# Patient Record
Sex: Female | Born: 1990 | Race: Black or African American | Hispanic: No | Marital: Married | Smoking: Never smoker
Health system: Southern US, Community
[De-identification: ages and names within clinical notes are randomized; demographics above are authoritative.]

## PROBLEM LIST (undated history)

## (undated) ENCOUNTER — Inpatient Hospital Stay (HOSPITAL_COMMUNITY): Payer: Self-pay

## (undated) DIAGNOSIS — IMO0002 Reserved for concepts with insufficient information to code with codable children: Secondary | ICD-10-CM

## (undated) DIAGNOSIS — B9689 Other specified bacterial agents as the cause of diseases classified elsewhere: Secondary | ICD-10-CM

## (undated) DIAGNOSIS — M329 Systemic lupus erythematosus, unspecified: Secondary | ICD-10-CM

## (undated) DIAGNOSIS — B373 Candidiasis of vulva and vagina: Secondary | ICD-10-CM

## (undated) DIAGNOSIS — N760 Acute vaginitis: Secondary | ICD-10-CM

## (undated) HISTORY — PX: CHOLECYSTECTOMY: SHX55

---

## 2015-12-09 ENCOUNTER — Emergency Department (HOSPITAL_COMMUNITY)
Admission: EM | Admit: 2015-12-09 | Discharge: 2015-12-09 | Disposition: A | Discharge status: Home / Self Care | Payer: Medicaid Other | Attending: Emergency Medicine | Admitting: Emergency Medicine

## 2015-12-09 ENCOUNTER — Encounter (HOSPITAL_COMMUNITY): Payer: Self-pay | Admitting: Emergency Medicine

## 2015-12-09 DIAGNOSIS — N760 Acute vaginitis: Secondary | ICD-10-CM | POA: Diagnosis not present

## 2015-12-09 DIAGNOSIS — Z79891 Long term (current) use of opiate analgesic: Secondary | ICD-10-CM | POA: Insufficient documentation

## 2015-12-09 DIAGNOSIS — N898 Other specified noninflammatory disorders of vagina: Secondary | ICD-10-CM | POA: Diagnosis present

## 2015-12-09 DIAGNOSIS — Z79899 Other long term (current) drug therapy: Secondary | ICD-10-CM | POA: Insufficient documentation

## 2015-12-09 DIAGNOSIS — B9689 Other specified bacterial agents as the cause of diseases classified elsewhere: Secondary | ICD-10-CM

## 2015-12-09 HISTORY — DX: Systemic lupus erythematosus, unspecified: M32.9

## 2015-12-09 HISTORY — DX: Reserved for concepts with insufficient information to code with codable children: IMO0002

## 2015-12-09 LAB — URINALYSIS, ROUTINE W REFLEX MICROSCOPIC
BILIRUBIN URINE: NEGATIVE
Glucose, UA: NEGATIVE mg/dL
KETONES UR: 40 mg/dL — AB
LEUKOCYTES UA: NEGATIVE
NITRITE: NEGATIVE
Specific Gravity, Urine: 1.04 — ABNORMAL HIGH (ref 1.005–1.030)
pH: 6 (ref 5.0–8.0)

## 2015-12-09 LAB — WET PREP, GENITAL
Sperm: NONE SEEN
Trich, Wet Prep: NONE SEEN
Yeast Wet Prep HPF POC: NONE SEEN

## 2015-12-09 LAB — URINE MICROSCOPIC-ADD ON

## 2015-12-09 LAB — PREGNANCY, URINE: Preg Test, Ur: NEGATIVE

## 2015-12-09 MED ORDER — AZITHROMYCIN 250 MG PO TABS
1000.0000 mg | ORAL_TABLET | Freq: Once | ORAL | Status: AC
  Administered 2015-12-09: 1000 mg via ORAL
  Filled 2015-12-09: qty 4

## 2015-12-09 MED ORDER — LIDOCAINE HCL 1 % IJ SOLN
INTRAMUSCULAR | Status: AC
  Administered 2015-12-09: 20 mL
  Filled 2015-12-09: qty 20

## 2015-12-09 MED ORDER — CEFTRIAXONE SODIUM 250 MG IJ SOLR
250.0000 mg | Freq: Once | INTRAMUSCULAR | Status: AC
  Administered 2015-12-09: 250 mg via INTRAMUSCULAR
  Filled 2015-12-09: qty 250

## 2015-12-09 MED ORDER — METRONIDAZOLE 500 MG PO TABS: 500.0000 mg | ORAL_TABLET | Freq: Two times a day (BID) | ORAL | Status: DC

## 2015-12-09 NOTE — Discharge Instructions (Signed)
Bacterial Vaginosis Bacterial vaginosis is an infection of the vagina. It happens when too many germs (bacteria) grow in the vagina. Having this infection puts you at risk for getting other infections from sex. Treating this infection can help lower your risk for other infections, such as:   Chlamydia.  Gonorrhea.  HIV.  Herpes. HOME CARE  Take your medicine as told by your doctor.  Finish your medicine even if you start to feel better.  Tell your sex partner that you have an infection. They should see their doctor for treatment.  During treatment:  Avoid sex or use condoms correctly.  Do not douche.  Do not drink alcohol unless your doctor tells you it is ok.  Do not breastfeed unless your doctor tells you it is ok. GET HELP IF:  You are not getting better after 3 days of treatment.  You have more grey fluid (discharge) coming from your vagina than before.  You have more pain than before.  You have a fever. MAKE SURE YOU:   Understand these instructions.  Will watch your condition.  Will get help right away if you are not doing well or get worse.   This information is not intended to replace advice given to you by your health care provider. Make sure you discuss any questions you have with your health care provider.   Take antibiotics as prescribed. You still have gonorrhea, chlamydia, HIV and syphilis results that are pending. These results will come back in the next 2-3 days. Please contact your rheumatologist to update them that you'll be taking antibiotics. Follow up with women's clinic for reevaluation or as needed. Return to the ED if you experience severe worsening of her symptoms, abdominal pain, fevers, pain with intercourse, burning with urination.

## 2015-12-09 NOTE — ED Notes (Signed)
Pt states that she is having malodorous vaginal discharge that started today.  Denies dysuria.  Denies abd pain.

## 2015-12-09 NOTE — ED Provider Notes (Signed)
CSN: [id number]     Arrival date & time 12/09/15  1454 History   First MD Initiated Contact with Patient 12/09/15 1656     Chief Complaint  Patient presents with  . Vaginal Discharge     (Consider location/radiation/quality/duration/timing/severity/associated sxs/prior Treatment) HPI   Kristina Vincent is a 25 year old female with a past medical history of lupus who presents to the ED today complaining of white vaginal discharge. Patient states her symptoms began today. She is associated external vaginal irritation but no itching. Her last measure. Was one month ago. She sexually active with one female partner and does not have any concern for STDs. Patient states that she feels that she may have a yeast infection. She denies any recent antibiotic use. Patient does take birth control. She denies any abdominal pain, fevers, chills, dyspareunia, dysuria or hematuria.  Past Medical History  Diagnosis Date  . Lupus (HCC)    History reviewed. No pertinent past surgical history. History reviewed. No pertinent family history. Social History  Substance Use Topics  . Smoking status: Never Smoker   . Smokeless tobacco: None  . Alcohol Use: No   OB History    No data available     Review of Systems  All other systems reviewed and are negative.     Allergies  Review of patient's allergies indicates no known allergies.  Home Medications   Prior to Admission medications   Medication Sig Start Date End Date Taking? Authorizing Provider  acetaminophen (TYLENOL) 500 MG tablet Take 500-1,000 mg by mouth every 6 (six) hours as needed for moderate pain or headache.   Yes Historical Provider, MD  calcium carbonate (OS-CAL - DOSED IN MG OF ELEMENTAL CALCIUM) 1250 (500 Ca) MG tablet Take 1 tablet by mouth daily with breakfast.   Yes Historical Provider, MD  diphenhydramine-acetaminophen (TYLENOL PM) 25-500 MG TABS tablet Take 1-2 tablets by mouth at bedtime as needed (pain).   Yes Historical  Provider, MD  lisinopril (PRINIVIL,ZESTRIL) 5 MG tablet Take 5 mg by mouth daily. 10/27/15  Yes Historical Provider, MD  MONONESSA 0.25-35 MG-MCG tablet Take 1 tablet by mouth daily. 09/30/15  Yes Historical Provider, MD  mycophenolate (CELLCEPT) 500 MG tablet Take 1,000 mg by mouth daily. 10/27/15  Yes Historical Provider, MD  omeprazole (PRILOSEC) 20 MG capsule Take 20 mg by mouth daily. 10/27/15  Yes Historical Provider, MD  predniSONE (DELTASONE) 20 MG tablet Take 20 mg by mouth daily with breakfast. 10/27/15  Yes Historical Provider, MD   BP 123/79 mmHg  Pulse 83  Temp(Src) 98.6 F (37 C) (Oral)  Resp 20  SpO2 100%  LMP 11/19/2015 (Approximate) Physical Exam  Constitutional: She is oriented to person, place, and time. She appears well-developed and well-nourished. No distress.  HENT:  Head: Normocephalic and atraumatic.  Eyes: Conjunctivae are normal. Right eye exhibits no discharge. Left eye exhibits no discharge. No scleral icterus.  Cardiovascular: Normal rate.   Pulmonary/Chest: Effort normal.  Abdominal: Soft. Bowel sounds are normal. She exhibits no distension. There is no tenderness. There is no rebound and no guarding.  Genitourinary:  No CMT. No adnexal tenderness. White vaginal discharge in vaginal vault. No external genital lesions.  Neurological: She is alert and oriented to person, place, and time. Coordination normal.  Skin: Skin is warm and dry. No rash noted. She is not diaphoretic. No erythema. No pallor.  Psychiatric: She has a normal mood and affect. Her behavior is normal.  Nursing note and vitals reviewed.   ED Course  Procedures (including critical care time) Labs Review Labs Reviewed  URINALYSIS, ROUTINE W REFLEX MICROSCOPIC (NOT AT [facility]) - Abnormal; Notable for the following:    APPearance CLOUDY (*)    Specific Gravity, Urine 1.040 (*)    Hgb urine dipstick MODERATE (*)    Ketones, ur 40 (*)    Protein, ur >300 (*)    All other components within normal  limits  URINE MICROSCOPIC-ADD ON - Abnormal; Notable for the following:    Squamous Epithelial / LPF 0-5 (*)    Bacteria, UA MANY (*)    All other components within normal limits  URINE CULTURE  WET PREP, GENITAL  PREGNANCY, URINE  RPR  HIV ANTIBODY (ROUTINE TESTING)  GC/CHLAMYDIA PROBE AMP ([place]) NOT AT [facility]    Imaging Review No results found. I have personally reviewed and evaluated these images and lab results as part of my medical decision-making.   EKG Interpretation None      MDM   Final diagnoses:  Vaginal discharge  Bacterial vaginosis    Patient to be discharged with instructions to follow up with OBGYN. Discussed importance of using protection when sexually active. Pt understands that they have GC/Chlamydia cultures pending and that they will need to inform all sexual partners if results return positive. Pt has been treated prophylacticly with azithromycin and rocephin due to pts history, pelvic exam, and wet prep with increased WBCs. Pt not concerning for PID because hemodynamically stable and no cervical motion tenderness on pelvic exam. Pt has also been treated with flagyl for Bacterial Vaginosis. I spoke with pharmacy as pt is on mycophenylate for lupus, pharmacist states that this should not cause a drug reaction with these antibiotics. Pt has been advised to not drink alcohol while on this medication. Pt will notify her rheumatologist that she is taking antibiotics.  Return precautions outlined in patient discharge instructions.       Lester Kinsman [place], PA-C 12/09/15 2101   Lorre Nick, MD 12/13/15 [phone]

## 2015-12-09 NOTE — Progress Notes (Signed)
Patient listed as having Medicaid insurance without a pcp.  [facility] on patient's Medicaid card [facility].  EDCM spoke to patient at bedside.  Patient reports her pcp is now 3 hours away as she has just moved here.  EDCM informed patient that [place].  [facility] with [facility] [place] providers who [place].  EDCM also provided patient with address and [place].  Patient thankful for resources.  No further EDCm needs at this time.

## 2015-12-10 LAB — HIV ANTIBODY (ROUTINE TESTING W REFLEX): HIV Screen 4th Generation wRfx: NONREACTIVE

## 2015-12-10 LAB — GC/CHLAMYDIA PROBE AMP (~~LOC~~) NOT AT ARMC
Chlamydia: POSITIVE — AB
Neisseria Gonorrhea: NEGATIVE

## 2015-12-10 LAB — RPR, QUANT+TP ABS (REFLEX)
Rapid Plasma Reagin, Quant: 1:1 {titer} — ABNORMAL HIGH
T Pallidum Abs: NEGATIVE

## 2015-12-10 LAB — SYPHILIS: RPR W/REFLEX TO RPR TITER AND TREPONEMAL ANTIBODIES, TRADITIONAL SCREENING AND DIAGNOSIS ALGORITHM: RPR Ser Ql: REACTIVE — AB

## 2015-12-11 ENCOUNTER — Telehealth (HOSPITAL_BASED_OUTPATIENT_CLINIC_OR_DEPARTMENT_OTHER): Payer: Self-pay

## 2015-12-11 LAB — URINE CULTURE

## 2015-12-13 ENCOUNTER — Telehealth (HOSPITAL_BASED_OUTPATIENT_CLINIC_OR_DEPARTMENT_OTHER): Payer: Self-pay | Admitting: Emergency Medicine

## 2015-12-14 ENCOUNTER — Encounter (HOSPITAL_COMMUNITY): Payer: Self-pay

## 2015-12-14 ENCOUNTER — Emergency Department (HOSPITAL_COMMUNITY)
Admission: EM | Admit: 2015-12-14 | Discharge: 2015-12-14 | Disposition: A | Discharge status: Home / Self Care | Payer: Medicaid Other | Attending: Emergency Medicine | Admitting: Emergency Medicine

## 2015-12-14 DIAGNOSIS — R21 Rash and other nonspecific skin eruption: Secondary | ICD-10-CM

## 2015-12-14 MED ORDER — CLOTRIMAZOLE 1 % EX CREA: TOPICAL_CREAM | CUTANEOUS | 0 refills | Status: DC

## 2015-12-14 NOTE — ED Notes (Signed)
Patient retrieved from waiting and escorted to room. Ambulatory from waiting to B19 with minimal difficulty. Patient given gown and asked to change.

## 2015-12-14 NOTE — ED Triage Notes (Signed)
Pt reports, weakenss and tired from lupus and feels like she is going to pass out and cant sleep and also reports rash between legs with irritaiton, pt sts recently treated for STD>

## 2015-12-14 NOTE — ED Notes (Signed)
Pt states he understands instructions. Home stable with steady gait. 

## 2015-12-14 NOTE — ED Provider Notes (Signed)
[facility]-EMERGENCY DEPT Provider Note   CSN: [id number] Arrival date & time: 12/14/15  4098  First Provider Contact:  First MD Initiated Contact with Patient 12/14/15 0700        History   Chief Complaint Chief Complaint  Patient presents with  . Rash  . Lupus    HPI Kristina Vincent is a 25 y.o. female.  HPI Patient presents to the emergency department with complaints of rash between her thighs.  She was recently diagnosed with a STD and started on Flagyl.  She reports ongoing vaginal discharge which is thin and watery.  She reports she's having to change her underwear several times a day.  She also reports history Lupus and feels like she might be having a lupus flare she has generalized fatigue and weakness and some lightheadedness.  She denies nausea and vomiting.    Past Medical History:  Diagnosis Date  . Lupus (HCC)     There are no active problems to display for this patient.   History reviewed. No pertinent surgical history.  OB History    No data available       Home Medications    Prior to Admission medications   Medication Sig Start Date End Date Taking? Authorizing Provider  metroNIDAZOLE (FLAGYL) 500 MG tablet Take 1 tablet (500 mg total) by mouth 2 (two) times daily. 12/09/15  Yes Samantha Tripp [place], PA-C  acetaminophen (TYLENOL) 500 MG tablet Take 500-1,000 mg by mouth every 6 (six) hours as needed for moderate pain or headache.    Historical Provider, MD  calcium carbonate (OS-CAL - DOSED IN MG OF ELEMENTAL CALCIUM) 1250 (500 Ca) MG tablet Take 1 tablet by mouth daily with breakfast.    Historical Provider, MD  clotrimazole (LOTRIMIN) 1 % cream Apply to affected area 2 times daily 12/14/15   Azalia Bilis, MD  diphenhydramine-acetaminophen (TYLENOL PM) 25-500 MG TABS tablet Take 1-2 tablets by mouth at bedtime as needed (pain).    Historical Provider, MD  lisinopril (PRINIVIL,ZESTRIL) 5 MG tablet Take 5 mg by mouth daily. 10/27/15   Historical Provider, MD    MONONESSA 0.25-35 MG-MCG tablet Take 1 tablet by mouth daily. 09/30/15   Historical Provider, MD  mycophenolate (CELLCEPT) 500 MG tablet Take 1,000 mg by mouth daily. 10/27/15   Historical Provider, MD  omeprazole (PRILOSEC) 20 MG capsule Take 20 mg by mouth daily. 10/27/15   Historical Provider, MD  predniSONE (DELTASONE) 20 MG tablet Take 20 mg by mouth daily with breakfast. 10/27/15   Historical Provider, MD    Family History History reviewed. No pertinent family history.  Social History Social History  Substance Use Topics  . Smoking status: Never Smoker  . Smokeless tobacco: Not on file  . Alcohol use No     Allergies   Review of patient's allergies indicates no known allergies.   Review of Systems Review of Systems  All other systems reviewed and are negative.    Physical Exam Updated Vital Signs BP 128/89 (BP Location: Left Arm)   Pulse 88   Temp 98.1 F (36.7 C) (Oral)   Resp 20   Ht  (1.6 m)   Wt 235 lb 12.8 oz (107 kg)   LMP 11/19/2015 (Approximate)   SpO2 100%   BMI 41.77 kg/m   Physical Exam  Constitutional: She is oriented to person, place, and time. She appears well-developed and well-nourished.  HENT:  Head: Normocephalic.  Eyes: EOM are [id number]: Normal range of motion.  Pulmonary/Chest:  Effort normal.  Abdominal: She exhibits no distension.  Genitourinary:  Genitourinary Comments: Chaperone present.  Mild maculopapular rash of her medial thighs and bilateral full R regions without significant erythema or warmth.  There is some skin breakdown in the folds of her thighs.  Musculoskeletal: Normal range of motion.  Neurological: She is alert and oriented to person, place, and time.  Psychiatric: She has a normal mood and affect.  Nursing note and vitals reviewed.    ED Treatments / Results  Labs (all labs ordered are listed, but only abnormal results are displayed) Labs Reviewed - No data to display  EKG  EKG Interpretation None        Radiology No results found.  Procedures Procedures (including critical care time)  Medications Ordered in ED Medications - No data to display   Initial Impression / Assessment and Plan / ED Course  I have reviewed the triage vital signs and the nursing notes.  Pertinent labs & imaging results that were available during my care of the patient were reviewed by me and considered in my medical decision making (see chart for details).  Clinical Course    Recommended clotrimazole cream, cotton underwear, increased airflow and improved drying of her skin.  Dermatology and GYN follow-up.  She understands to return to the ER for new or worsening symptoms  Final Clinical Impressions(s) / ED Diagnoses   Final diagnoses:  Rash    New Prescriptions New Prescriptions   CLOTRIMAZOLE (LOTRIMIN) 1 % CREAM    Apply to affected area 2 times daily     Azalia Bilis, MD 12/14/15 [phone]

## 2015-12-14 NOTE — ED Notes (Signed)
MD at bedside. 

## 2016-01-24 ENCOUNTER — Emergency Department (HOSPITAL_COMMUNITY)
Admission: EM | Admit: 2016-01-24 | Discharge: 2016-01-24 | Disposition: A | Discharge status: Home / Self Care | Payer: Medicaid Other | Attending: Emergency Medicine | Admitting: Emergency Medicine

## 2016-01-24 ENCOUNTER — Emergency Department (HOSPITAL_COMMUNITY): Payer: Medicaid Other

## 2016-01-24 ENCOUNTER — Encounter (HOSPITAL_COMMUNITY): Payer: Self-pay | Admitting: Vascular Surgery

## 2016-01-24 DIAGNOSIS — R109 Unspecified abdominal pain: Secondary | ICD-10-CM

## 2016-01-24 DIAGNOSIS — R1030 Lower abdominal pain, unspecified: Secondary | ICD-10-CM | POA: Diagnosis not present

## 2016-01-24 DIAGNOSIS — Z3A01 Less than 8 weeks gestation of pregnancy: Secondary | ICD-10-CM | POA: Insufficient documentation

## 2016-01-24 DIAGNOSIS — O26891 Other specified pregnancy related conditions, first trimester: Secondary | ICD-10-CM | POA: Insufficient documentation

## 2016-01-24 DIAGNOSIS — O0281 Inappropriate change in quantitative human chorionic gonadotropin (hCG) in early pregnancy: Secondary | ICD-10-CM | POA: Insufficient documentation

## 2016-01-24 DIAGNOSIS — O26899 Other specified pregnancy related conditions, unspecified trimester: Secondary | ICD-10-CM

## 2016-01-24 DIAGNOSIS — Z3490 Encounter for supervision of normal pregnancy, unspecified, unspecified trimester: Secondary | ICD-10-CM

## 2016-01-24 LAB — COMPREHENSIVE METABOLIC PANEL
ALBUMIN: 3.3 g/dL — AB (ref 3.5–5.0)
ALK PHOS: 62 U/L (ref 38–126)
ALT: 19 U/L (ref 14–54)
ANION GAP: 7 (ref 5–15)
AST: 26 U/L (ref 15–41)
BILIRUBIN TOTAL: 0.9 mg/dL (ref 0.3–1.2)
BUN: 10 mg/dL (ref 6–20)
CALCIUM: 9.7 mg/dL (ref 8.9–10.3)
CO2: 22 mmol/L (ref 22–32)
Chloride: 107 mmol/L (ref 101–111)
Creatinine, Ser: 0.84 mg/dL (ref 0.44–1.00)
GFR calc Af Amer: >60 mL/min (ref >60)
GLUCOSE: 109 mg/dL — AB (ref 65–99)
Potassium: 4.1 mmol/L (ref 3.5–5.1)
Sodium: 136 mmol/L (ref 135–145)
TOTAL PROTEIN: 6.9 g/dL (ref 6.5–8.1)

## 2016-01-24 LAB — URINALYSIS, ROUTINE W REFLEX MICROSCOPIC
BILIRUBIN URINE: NEGATIVE
Glucose, UA: NEGATIVE mg/dL
KETONES UR: NEGATIVE mg/dL
NITRITE: NEGATIVE
Protein, ur: >300 mg/dL — AB
Specific Gravity, Urine: 1.022 (ref 1.005–1.030)
pH: 7 (ref 5.0–8.0)

## 2016-01-24 LAB — URINE MICROSCOPIC-ADD ON

## 2016-01-24 LAB — WET PREP, GENITAL
CLUE CELLS WET PREP: NONE SEEN
Sperm: NONE SEEN
TRICH WET PREP: NONE SEEN
Yeast Wet Prep HPF POC: NONE SEEN

## 2016-01-24 LAB — CBC
HCT: 39 % (ref 36.0–46.0)
HEMOGLOBIN: 13.1 g/dL (ref 12.0–15.0)
MCH: 28.9 pg (ref 26.0–34.0)
MCHC: 33.6 g/dL (ref 30.0–36.0)
MCV: 86.1 fL (ref 78.0–100.0)
Platelets: 285 10*3/uL (ref 150–400)
RBC: 4.53 MIL/uL (ref 3.87–5.11)
RDW: 13.1 % (ref 11.5–15.5)
WBC: 9.6 10*3/uL (ref 4.0–10.5)

## 2016-01-24 LAB — LIPASE, BLOOD: Lipase: 27 U/L (ref 11–51)

## 2016-01-24 LAB — HCG, QUANTITATIVE, PREGNANCY: HCG, BETA CHAIN, QUANT, S: 41980 m[IU]/mL — AB (ref <5)

## 2016-01-24 NOTE — ED Notes (Signed)
Pt ambulated to room from waiting room, tolerated well. 

## 2016-01-24 NOTE — Discharge Instructions (Signed)

## 2016-01-24 NOTE — ED Provider Notes (Signed)
Emergency Department Provider Note   I have reviewed the triage vital signs and the nursing notes.   HISTORY  Chief Complaint Abdominal Pain   HPI Kristina Vincent is a 25 y.o. female with PMH of lupus presents to the emergency department for evaluation of several days of lower abdominal cramping and vaginal discharge. Patient states that she is pregnant and believes it is an early pregnancy. She was told never to get pregnant with her lupus and was attempting a home remedy to induce a miscarriage. Patient states that 2 days ago she drank a mixture of pineapple juice, cinnamon, vitamin C. She began having lower abdominal cramping but no vaginal bleeding. No passage of clot or tissue. She notes some white vaginal discharge. Denies upper abdominal pain, diarrhea, vomiting. She has been compliant with her other home medications. She is new to the area and has not established care with an OB/GYN.    Past Medical History:  Diagnosis Date  . Lupus (HCC)     There are no active problems to display for this patient.   Past Surgical History:  Procedure Laterality Date  . CHOLECYSTECTOMY      Current Outpatient Rx  . Order #: [id number] Class: Historical Med  . Order #: [id number] Class: Historical Med  . Order #: [id number] Class: Historical Med  . Order #: [id number] Class: Historical Med  . Order #: [id number] Class: Historical Med  . Order #: [id number] Class: Print  . Order #: [id number] Class: Print    Allergies Review of patient's allergies indicates no known allergies.  History reviewed. No pertinent family history.  Social History Social History  Substance Use Topics  . Smoking status: Never Smoker  . Smokeless tobacco: Never Used  . Alcohol use No    Review of Systems  Constitutional: No fever/chills Eyes: No visual changes. ENT: No sore throat. Cardiovascular: Denies chest pain. Respiratory: Denies shortness of breath. Gastrointestinal: Positive lower abdominal pain.   No nausea, no vomiting.  No diarrhea.  No constipation. Genitourinary: Negative for dysuria. Positive white vaginal discharge.  Musculoskeletal: Negative for back pain. Skin: Negative for rash. Neurological: Negative for headaches, focal weakness or numbness.  10-point ROS otherwise negative.  ____________________________________________   PHYSICAL EXAM:  VITAL SIGNS: ED Triage Vitals  Enc Vitals Group     BP 01/24/16 1532 139/78     Pulse Rate 01/24/16 1532 94     Resp 01/24/16 1532 16     Temp 01/24/16 1532 99.2 F (37.3 C)     Temp Source 01/24/16 1532 Oral     SpO2 01/24/16 1532 99 %     Pain Score 01/24/16 1531 7   Constitutional: Alert and oriented. Well appearing and in no acute distress. Eyes: Conjunctivae are normal.  Head: Atraumatic. Nose: No congestion/rhinnorhea. Mouth/Throat: Mucous membranes are moist.  Oropharynx non-erythematous. Neck: No stridor.   Cardiovascular: Normal rate, regular rhythm. Good peripheral circulation. Grossly normal heart sounds.   Respiratory: Normal respiratory effort.  No retractions. Lungs CTAB. Gastrointestinal: Soft and nontender. No distention.  Genitourinary: creamy thick vaginal discharge. No CMT. No adnexal fullness or tenderness. Cervix visually closed. No bleeding identified.  Musculoskeletal: No lower extremity tenderness nor edema. No gross deformities of extremities. Neurologic:  Normal speech and language. No gross focal neurologic deficits are appreciated.  Skin:  Skin is warm, dry and intact. No rash noted. Psychiatric: Mood and affect are normal. Speech and behavior are normal.  ____________________________________________   LABS (all labs ordered are listed, but only abnormal results are displayed)  Labs Reviewed  WET PREP, GENITAL - Abnormal; Notable for the following:       Result Value   WBC, Wet Prep HPF POC MANY (*)    All other components within normal limits  COMPREHENSIVE METABOLIC PANEL - Abnormal;  Notable for the following:    Glucose, Bld 109 (*)    Albumin 3.3 (*)    All other components within normal limits  URINALYSIS, ROUTINE W REFLEX MICROSCOPIC (NOT AT [facility] - Abnormal; Notable for the following:    APPearance HAZY (*)    Hgb urine dipstick SMALL (*)    Protein, ur >300 (*)    Leukocytes, UA SMALL (*)    All other components within normal limits  HCG, QUANTITATIVE, PREGNANCY - Abnormal; Notable for the following:    hCG, Beta Chain, Quant, S 41,980 (*)    All other components within normal limits  URINE MICROSCOPIC-ADD ON - Abnormal; Notable for the following:    Squamous Epithelial / LPF 6-30 (*)    Bacteria, UA FEW (*)    All other components within normal limits  LIPASE, BLOOD  CBC  GC/CHLAMYDIA PROBE AMP ([place]) NOT AT [facility]   ____________________________________________  RADIOLOGY  US Ob Comp Less 14 Wks  Result Date: 01/24/2016 CLINICAL DATA:  Lower abdominal pain. Nausea, vomiting, diarrhea, and vaginal discharge. History of lupus. Quantitative beta HCG is 41,980.Unknown LMP. EXAM: OBSTETRIC <14 WK [place] AND TRANSVAGINAL OB US TECHNIQUE: Both transabdominal and transvaginal ultrasound examinations were performed for complete evaluation of the gestation as well as the maternal uterus, adnexal regions, and pelvic cul-de-sac. Transvaginal technique was performed to assess early pregnancy. COMPARISON:  None. FINDINGS: Intrauterine gestational sac: Present Yolk sac:  Present Embryo:  Not seen Cardiac Activity: Not seen Heart Rate: Absent  bpm MSD: 19.3  mm   6 w   6  d Subchorionic hemorrhage:  None visualized. Maternal uterus/adnexae: Right ovary is not visualized. Left ovarian follicle versus corpus luteum cyst is 2.5 cm. No free pelvic fluid. Study quality is degraded by patient body habitus and retroverted uterus. IMPRESSION: 1. Intrauterine gestational sac is present. Embryo is not yet visualized. 2. Follow-up ultrasound is recommended in 14 or more days to document  presence of fetal pole and for dating purposes. Electronically Signed   By: Norva Pavlov M.D.   On: 01/24/2016 21:04   US Ob Transvaginal  Result Date: 01/24/2016 CLINICAL DATA:  Lower abdominal pain. Nausea, vomiting, diarrhea, and vaginal discharge. History of lupus. Quantitative beta HCG is 41,980.Unknown LMP. EXAM: OBSTETRIC <14 WK [place] AND TRANSVAGINAL OB US TECHNIQUE: Both transabdominal and transvaginal ultrasound examinations were performed for complete evaluation of the gestation as well as the maternal uterus, adnexal regions, and pelvic cul-de-sac. Transvaginal technique was performed to assess early pregnancy. COMPARISON:  None. FINDINGS: Intrauterine gestational sac: Present Yolk sac:  Present Embryo:  Not seen Cardiac Activity: Not seen Heart Rate: Absent  bpm MSD: 19.3  mm   6 w   6  d Subchorionic hemorrhage:  None visualized. Maternal uterus/adnexae: Right ovary is not visualized. Left ovarian follicle versus corpus luteum cyst is 2.5 cm. No free pelvic fluid. Study quality is degraded by patient body habitus and retroverted uterus. IMPRESSION: 1. Intrauterine gestational sac is present. Embryo is not yet visualized. 2. Follow-up ultrasound is recommended in 14 or more days to document presence of fetal pole and for dating purposes. Electronically Signed   By: Norva Pavlov M.D.   On: 01/24/2016 21:04  ____________________________________________   PROCEDURES  Procedure(s) performed:   Procedures  None ____________________________________________   INITIAL IMPRESSION / ASSESSMENT AND PLAN / ED COURSE  Pertinent labs & imaging results that were available during my care of the patient were reviewed by me and considered in my medical decision making (see chart for details).  Patient resents to the emergency department for evaluation of lower abdominal cramping and white vaginal discharge in the setting of trying to induce a miscarriage at home with drinking pineapple  juice, cinnamon, vitamin C. No vaginal bleeding. No abdominal pain. The patient has a soft nontender abdomen. Plan for pelvic exam to further evaluate white vaginal discharge. Patient does have a history of lupus denies any chest pain or difficulty breathing. He is interested in terminating her pregnancy.   09:15 PM My bedside ultrasound did not visualize a fetal pole. Patient sent for formal ultrasound which shows intrauterine yolk sac without fetal pole. Suspect early intrauterine pregnancy. No evidence of ectopic pregnancy either by exam or ultrasound. Discussed this with patient in detail. Will refer for OB/GYN follow-up to discuss pregnancy. Discussed return precautions in detail.   At this time, I do not feel there is any life-threatening condition present. I have reviewed and discussed all results (EKG, imaging, lab, urine as appropriate), exam findings with patient. I have reviewed nursing notes and appropriate previous records.  I feel the patient is safe to be discharged home without further emergent workup. Discussed usual and customary return precautions. Patient and family (if present) verbalize understanding and are comfortable with this plan.  Patient will follow-up with their primary care provider. If they do not have a primary care provider, information for follow-up has been provided to them. All questions have been answered.  ____________________________________________  FINAL CLINICAL IMPRESSION(S) / ED DIAGNOSES  Final diagnoses:  Lower abdominal pain  Pregnancy  Abdominal pain affecting pregnancy     MEDICATIONS GIVEN DURING THIS VISIT:  None  NEW OUTPATIENT MEDICATIONS STARTED DURING THIS VISIT:  None   Note:  This document was prepared using Dragon voice recognition software and may include unintentional dictation errors.  Alona BeneJoshua Long, MD Emergency Medicine   Maia PlanJoshua G Long, MD 01/25/16 [id number]

## 2016-01-24 NOTE — ED Triage Notes (Signed)
Pt reports to the ED for eval of low abd pain, N/V/D, and vaginal d/c. Describes the d/c as white and thick and she is having some vaginal irritation as well. Pt reports she has lupus and found out she was pregnant but she cannot have a baby or she will die so she tried to do an at home abortion with pineapple juice, cinnamon, and Vitamin C mixture. Drank the mixture on Saturday and the abd pain, and N/V/D started after. Denies any vaginal bleeding.

## 2016-01-25 LAB — GC/CHLAMYDIA PROBE AMP (~~LOC~~) NOT AT ARMC
CHLAMYDIA, DNA PROBE: NEGATIVE
NEISSERIA GONORRHEA: NEGATIVE

## 2016-01-30 ENCOUNTER — Emergency Department (HOSPITAL_COMMUNITY): Payer: Medicaid Other

## 2016-01-30 ENCOUNTER — Encounter (HOSPITAL_COMMUNITY): Payer: Self-pay | Admitting: Emergency Medicine

## 2016-01-30 ENCOUNTER — Emergency Department (HOSPITAL_COMMUNITY)
Admission: EM | Admit: 2016-01-30 | Discharge: 2016-01-30 | Disposition: A | Discharge status: Home / Self Care | Payer: Medicaid Other | Attending: Emergency Medicine | Admitting: Emergency Medicine

## 2016-01-30 DIAGNOSIS — O0281 Inappropriate change in quantitative human chorionic gonadotropin (hCG) in early pregnancy: Secondary | ICD-10-CM | POA: Diagnosis not present

## 2016-01-30 DIAGNOSIS — R071 Chest pain on breathing: Secondary | ICD-10-CM | POA: Insufficient documentation

## 2016-01-30 DIAGNOSIS — O9989 Other specified diseases and conditions complicating pregnancy, childbirth and the puerperium: Secondary | ICD-10-CM | POA: Insufficient documentation

## 2016-01-30 DIAGNOSIS — Z3A01 Less than 8 weeks gestation of pregnancy: Secondary | ICD-10-CM | POA: Diagnosis not present

## 2016-01-30 DIAGNOSIS — R079 Chest pain, unspecified: Secondary | ICD-10-CM

## 2016-01-30 LAB — CBC
HCT: 38.6 % (ref 36.0–46.0)
Hemoglobin: 12.8 g/dL (ref 12.0–15.0)
MCH: 28.4 pg (ref 26.0–34.0)
MCHC: 33.2 g/dL (ref 30.0–36.0)
MCV: 85.6 fL (ref 78.0–100.0)
Platelets: 291 10*3/uL (ref 150–400)
RBC: 4.51 MIL/uL (ref 3.87–5.11)
RDW: 12.7 % (ref 11.5–15.5)
WBC: 10.8 10*3/uL — ABNORMAL HIGH (ref 4.0–10.5)

## 2016-01-30 LAB — BASIC METABOLIC PANEL
Anion gap: 9 (ref 5–15)
BUN: 8 mg/dL (ref 6–20)
CO2: 20 mmol/L — ABNORMAL LOW (ref 22–32)
Calcium: 9.4 mg/dL (ref 8.9–10.3)
Chloride: 104 mmol/L (ref 101–111)
Creatinine, Ser: 0.75 mg/dL (ref 0.44–1.00)
GFR calc Af Amer: >60 mL/min (ref >60)
GFR calc non Af Amer: >60 mL/min (ref >60)
Glucose, Bld: 89 mg/dL (ref 65–99)
Potassium: 4 mmol/L (ref 3.5–5.1)
Sodium: 133 mmol/L — ABNORMAL LOW (ref 135–145)

## 2016-01-30 LAB — I-STAT TROPONIN, ED
Troponin i, poc: 0 ng/mL (ref 0.00–0.08)
Troponin i, poc: 0 ng/mL (ref 0.00–0.08)

## 2016-01-30 LAB — I-STAT BETA HCG BLOOD, ED (MC, WL, AP ONLY): I-stat hCG, quantitative: >2000 m[IU]/mL — ABNORMAL HIGH (ref <5)

## 2016-01-30 MED ORDER — IOPAMIDOL (ISOVUE-370) INJECTION 76%
INTRAVENOUS | Status: AC
  Administered 2016-01-30: 100 mL
  Filled 2016-01-30: qty 100

## 2016-01-30 NOTE — ED Notes (Signed)
IV unsuccessful x 2 by this nurse.

## 2016-01-30 NOTE — ED Notes (Signed)
Pt transported to CT ?

## 2016-01-30 NOTE — ED Triage Notes (Signed)
Pt sts hx of lupus and unsure if having flare but sts palpitations starting this am

## 2016-01-30 NOTE — Discharge Instructions (Signed)
Please read and follow all provided instructions.  Your diagnoses today include:  1. Chest pain, unspecified chest pain type     Tests performed today include: An EKG of your heart A chest x-ray Cardiac enzymes - a blood test for heart muscle damage Blood counts and electrolytes Vital signs. See below for your results today.   Medications prescribed:   Take any prescribed medications only as directed.  Follow-up instructions: Please follow-up with your primary care provider as soon as you can for further evaluation of your symptoms.   Return instructions:  SEEK IMMEDIATE MEDICAL ATTENTION IF: You have severe chest pain, especially if the pain is crushing or pressure-like and spreads to the arms, back, neck, or jaw, or if you have sweating, nausea (feeling sick to your stomach), or shortness of breath. THIS IS AN EMERGENCY. Don't wait to see if the pain will go away. Get medical help at once. Call 911 or 0 (operator). DO NOT drive yourself to the hospital.  Your chest pain gets worse and does not go away with rest.  You have an attack of chest pain lasting longer than usual, despite rest and treatment with the medications your caregiver has prescribed.  You wake from sleep with chest pain or shortness of breath. You feel dizzy or faint. You have chest pain not typical of your usual pain for which you originally saw your caregiver.  You have any other emergent concerns regarding your health.  Additional Information: Chest pain comes from many different causes. Your caregiver has diagnosed you as having chest pain that is not specific for one problem, but does not require admission.  You are at low risk for an acute heart condition or other serious illness.   Your vital signs today were: BP 138/68 (BP Location: Left Arm)    Pulse 80    Temp 99 F (37.2 C) (Oral)    Resp 16    SpO2 100%  If your blood pressure (BP) was elevated above 135/85 this visit, please have this repeated by  your doctor within one month. --------------

## 2016-01-30 NOTE — ED Provider Notes (Signed)
[facility]-EMERGENCY DEPT Provider Note   CSN: [id number] Arrival date & time: 01/30/16  1203     History   Chief Complaint Chief Complaint  Patient presents with  . Palpitations    HPI Kristina Vincent is a 25 y.o. female.  HPI  25 y.o. female with a hx of Lupus, presents to the Emergency Department today complaining of intermittent chest pains since 2AM this morning. No hx of the same. States that the pain is 4/10 and a sharp pain that is isolated to anterior chest. Notes worsening with inspiration. States that the pain comes in waves and lasts for 30-40 minutes and then dissipates, but has been occurring all day. Described as sharp sensations. No pressure. No N/V/D. No diaphoresis. No hx DVT/PE. No hx ACS. No fevers. No numbness/tingling. Pt tried OTC tylenol with minimal relief. Pt is also noted to be in her early 1st trimester of an unintentional pregnancy. Pt unsure of how far along she is, but she had an ultrasound done on 01-24-16 with IUP confirmed with no fetal pole visualized. Pt states that's he does not want to keep the pregnancy and is determined to terminate the pregnancy next week. No other symptoms noted.    Past Medical History:  Diagnosis Date  . Lupus (HCC)     There are no active problems to display for this patient.   Past Surgical History:  Procedure Laterality Date  . CHOLECYSTECTOMY      OB History    Gravida Para Term Preterm AB Living   1             SAB TAB Ectopic Multiple Live Births                   Home Medications    Prior to Admission medications   Medication Sig Start Date End Date Taking? Authorizing Provider  acetaminophen (TYLENOL) 500 MG tablet Take 500-1,000 mg by mouth every 6 (six) hours as needed for moderate pain or headache.    Historical Provider, MD  calcium carbonate (OS-CAL - DOSED IN MG OF ELEMENTAL CALCIUM) 1250 (500 Ca) MG tablet Take 1 tablet by mouth daily with breakfast.    Historical Provider, MD  clotrimazole (LOTRIMIN) 1  % cream Apply to affected area 2 times daily Patient not taking: Reported on 01/24/2016 12/14/15   Azalia Bilis, MD  diphenhydramine-acetaminophen (TYLENOL PM) 25-500 MG TABS tablet Take 1-2 tablets by mouth at bedtime as needed (pain).    Historical Provider, MD  lisinopril (PRINIVIL,ZESTRIL) 5 MG tablet Take 5 mg by mouth daily. 10/27/15   Historical Provider, MD  metroNIDAZOLE (FLAGYL) 500 MG tablet Take 1 tablet (500 mg total) by mouth 2 (two) times daily. Patient not taking: Reported on 01/24/2016 12/09/15   Samantha Tripp Dowless, PA-C  MONONESSA 0.25-35 MG-MCG tablet Take 1 tablet by mouth daily. 09/30/15   Historical Provider, MD    Family History History reviewed. No pertinent family history.  Social History Social History  Substance Use Topics  . Smoking status: Never Smoker  . Smokeless tobacco: Never Used  . Alcohol use No     Allergies   Review of patient's allergies indicates no known allergies.   Review of Systems Review of Systems ROS reviewed and all are negative for acute change except as noted in the HPI.  Physical Exam Updated Vital Signs BP 138/68 (BP Location: Left Arm)   Pulse 80   Temp 99 F (37.2 C) (Oral)   Resp 16   SpO2  100%   Physical Exam  Constitutional: She is oriented to person, place, and time. Vital signs are normal. She appears well-developed and well-nourished.  HENT:  Head: Normocephalic and atraumatic.  Right Ear: Hearing normal.  Left Ear: Hearing normal.  Eyes: Conjunctivae and EOM are normal. Pupils are equal, round, and reactive to light.  Neck: Normal range of motion. Neck supple.  Cardiovascular: Normal rate, regular rhythm, normal heart sounds and intact distal pulses.   Pulmonary/Chest: Effort normal and breath sounds normal. No respiratory distress. She has no wheezes. She has no rales. She exhibits tenderness.  Abdominal: Soft.  Musculoskeletal: Normal range of motion.  Neurological: She is alert and oriented to person, place,  and time.  Skin: Skin is warm and dry.  Psychiatric: She has a normal mood and affect. Her speech is normal and behavior is normal. Thought content normal.  Nursing note and vitals reviewed.  ED Treatments / Results  Labs (all labs ordered are listed, but only abnormal results are displayed) Labs Reviewed  BASIC METABOLIC PANEL - Abnormal; Notable for the following:       Result Value   Sodium 133 (*)    CO2 20 (*)    All other components within normal limits  CBC - Abnormal; Notable for the following:    WBC 10.8 (*)    All other components within normal limits  I-STAT BETA HCG BLOOD, ED ([facility], [facility], [facility] ONLY) - Abnormal; Notable for the following:    I-stat hCG, quantitative >2,000.0 (*)    All other components within normal limits  I-STAT TROPOININ, ED  Rosezena SensorI-STAT TROPOININ, ED    EKG  EKG Interpretation None      Radiology Dg Chest 2 View  Result Date: 01/30/2016 CLINICAL DATA:  Lower abdominal cramping. EXAM: CHEST  2 VIEW COMPARISON:  None. FINDINGS: The heart size and mediastinal contours are within normal limits. Both lungs are clear. No pneumothorax or pleural effusion is noted. The visualized skeletal structures are unremarkable. IMPRESSION: No active cardiopulmonary disease. Electronically Signed   By: Lupita RaiderJames  Green Jr, M.D.   On: 01/30/2016 12:57   Ct Angio Chest Pe W And/or Wo Contrast  Result Date: 01/30/2016 CLINICAL DATA:  History of lupus, history of palpitations. Patient is pregnant and was shielded. EXAM: CT ANGIOGRAPHY CHEST WITH CONTRAST TECHNIQUE: Multidetector CT imaging of the chest was performed using the standard protocol during bolus administration of intravenous contrast. Multiplanar CT image reconstructions and MIPs were obtained to evaluate the vascular anatomy. CONTRAST:  100 mL of Isovue 370 intravenous COMPARISON:  Chest radiograph 01/30/2016 FINDINGS: Suboptimal lower order pulmonary artery branch opacification with contrast. No evidence for filling defect  within the central, or main segmental pulmonary arteries to suggest the presence of an acute embolus. Heart size is mildly enlarged. No large pericardial effusion. Thoracic aorta demonstrates normal caliber. No aneurysm. There is triangular shaped soft tissue density within the anterior mediastinum, felt to represent thymic residual. No significantly enlarged axillary or mediastinal lymph nodes. Sub cm right hilar lymph node. Trachea and mainstem bronchi are within normal limits. The lung fields demonstrate no acute infiltrate, consolidation, or pleural effusion. No pulmonary nodule or pneumothorax identified. Limited images through the upper abdomen are limited by streak artifact. No acute osseous abnormality. Review of the MIP images confirms the above findings. IMPRESSION: 1. No definite CT evidence for acute pulmonary embolus. 2. Heart size appears slightly enlarged. 3. No acute pulmonary infiltrates are present. Electronically Signed   By: Adrian ProwsKim  Fujinaga M.D.  On: 01/30/2016 17:05   Procedures Procedures (including critical care time)  Medications Ordered in ED Medications - No data to display   Initial Impression / Assessment and Plan / ED Course  I have reviewed the triage vital signs and the nursing notes.  Pertinent labs & imaging results that were available during my care of the patient were reviewed by me and considered in my medical decision making (see chart for details).  Clinical Course   Final Clinical Impressions(s) / ED Diagnoses  I have reviewed and evaluated the relevant laboratory values I have reviewed and evaluated the relevant imaging studies.  I have interpreted the relevant EKG. I have reviewed the relevant previous healthcare records. I obtained HPI from historian. Patient discussed with supervising physician  ED Course:  Assessment: Pt is a 24yF with hx Lupus who presents with chest pain with onset this AM around 0200. States intermittent pains that are worse with  inspiration. Duration 30-40 min. No hx ACS. No Hx DVT/PE. PT does have risk factors for PE with Lupus and pregnancy. On exam, pt in NAD. Nontoxic/nonseptic appearing. VSS. Afebrile. Lungs CTA. Heart RRR. Abdomen nontender soft. Labs otherwise unremarkable. Trop negative x2. EKG unremarkable. CXR unremarkable. Discussed with patient given her risk factors of lupus and pregnant for pulmonary embolism. Pt planning to terminate pregnancy and gives consent to have CT angiography to rule out PE. Given option of VQ scan, but patient gives consent for and requests CT Angiography. CT showed no evidence of pulmonary embolism  Plan is to DC home with follow up to PCP. At time of discharge, Patient is in no acute distress. Vital Signs are stable. Patient is able to ambulate. Patient able to tolerate PO.   Disposition/Plan:  DC Home Additional Verbal discharge instructions given and discussed with patient.  Pt Instructed to f/u with PCP in the next week for evaluation and treatment of symptoms. Return precautions given Pt acknowledges and agrees with plan  Supervising Physician Raeford Razor, MD   Final diagnoses:  Chest pain, unspecified chest pain type    New Prescriptions New Prescriptions   No medications on file     Audry Pili, PA-C 01/30/16 1743    Raeford Razor, MD 01/31/16 1245

## 2016-06-08 ENCOUNTER — Emergency Department (HOSPITAL_COMMUNITY)
Admission: EM | Admit: 2016-06-08 | Discharge: 2016-06-08 | Disposition: A | Discharge status: Home / Self Care | Payer: Medicaid Other | Attending: Emergency Medicine | Admitting: Emergency Medicine

## 2016-06-08 ENCOUNTER — Encounter (HOSPITAL_COMMUNITY): Payer: Self-pay

## 2016-06-08 DIAGNOSIS — M545 Low back pain, unspecified: Secondary | ICD-10-CM

## 2016-06-08 DIAGNOSIS — M329 Systemic lupus erythematosus, unspecified: Secondary | ICD-10-CM | POA: Insufficient documentation

## 2016-06-08 DIAGNOSIS — Z79899 Other long term (current) drug therapy: Secondary | ICD-10-CM | POA: Diagnosis not present

## 2016-06-08 LAB — CBC WITH DIFFERENTIAL/PLATELET
BASOS ABS: 0 10*3/uL (ref 0.0–0.1)
Basophils Relative: 0 %
Eosinophils Absolute: 0.1 10*3/uL (ref 0.0–0.7)
Eosinophils Relative: 1 %
HEMATOCRIT: 39.5 % (ref 36.0–46.0)
Hemoglobin: 12.9 g/dL (ref 12.0–15.0)
LYMPHS PCT: 35 %
Lymphs Abs: 3.3 10*3/uL (ref 0.7–4.0)
MCH: 27.7 pg (ref 26.0–34.0)
MCHC: 32.7 g/dL (ref 30.0–36.0)
MCV: 84.8 fL (ref 78.0–100.0)
MONO ABS: 0.5 10*3/uL (ref 0.1–1.0)
Monocytes Relative: 5 %
NEUTROS ABS: 5.4 10*3/uL (ref 1.7–7.7)
Neutrophils Relative %: 59 %
Platelets: 272 10*3/uL (ref 150–400)
RBC: 4.66 MIL/uL (ref 3.87–5.11)
RDW: 13.2 % (ref 11.5–15.5)
WBC: 9.3 10*3/uL (ref 4.0–10.5)

## 2016-06-08 LAB — URINALYSIS, ROUTINE W REFLEX MICROSCOPIC
BILIRUBIN URINE: NEGATIVE
Glucose, UA: NEGATIVE mg/dL
HGB URINE DIPSTICK: NEGATIVE
KETONES UR: NEGATIVE mg/dL
LEUKOCYTES UA: NEGATIVE
NITRITE: NEGATIVE
PROTEIN: 100 mg/dL — AB
Specific Gravity, Urine: 1.024 (ref 1.005–1.030)
pH: 6 (ref 5.0–8.0)

## 2016-06-08 LAB — BASIC METABOLIC PANEL
ANION GAP: 5 (ref 5–15)
BUN: 13 mg/dL (ref 6–20)
CALCIUM: 9.2 mg/dL (ref 8.9–10.3)
CHLORIDE: 110 mmol/L (ref 101–111)
CO2: 25 mmol/L (ref 22–32)
Creatinine, Ser: 0.86 mg/dL (ref 0.44–1.00)
GFR calc Af Amer: >60 mL/min (ref >60)
GFR calc non Af Amer: >60 mL/min (ref >60)
GLUCOSE: 84 mg/dL (ref 65–99)
POTASSIUM: 4.3 mmol/L (ref 3.5–5.1)
Sodium: 140 mmol/L (ref 135–145)

## 2016-06-08 LAB — POC URINE PREG, ED: Preg Test, Ur: NEGATIVE

## 2016-06-08 MED ORDER — ONDANSETRON 4 MG PO TBDP: 4.0000 mg | ORAL_TABLET | Freq: Three times a day (TID) | ORAL | 0 refills | Status: DC | PRN

## 2016-06-08 MED ORDER — ONDANSETRON 4 MG PO TBDP
4.0000 mg | ORAL_TABLET | Freq: Once | ORAL | Status: AC
  Administered 2016-06-08: 4 mg via ORAL
  Filled 2016-06-08: qty 1

## 2016-06-08 MED ORDER — HYDROCODONE-ACETAMINOPHEN 5-325 MG PO TABS: 1.0000 | ORAL_TABLET | Freq: Four times a day (QID) | ORAL | 0 refills | Status: DC | PRN

## 2016-06-08 NOTE — ED Triage Notes (Signed)
Pt reports she has been laving lower back pain as well as nausea. Pt feels as though it is a lupus flare. Pt ambulatory.

## 2016-06-08 NOTE — ED Provider Notes (Signed)
[facility]PT Provider Note   CSN: [id number] Arrival date & time: 06/08/16  1350     History   Chief Complaint Chief Complaint  Patient presents with  . Back Pain  . Nausea    HPI Kristina Vincent is a 26 y.o. female.  Patient is a 26 year old female with a history of lupus presenting today with generalized lower back pain for the last 3-4 days. She intermittently gets this pain with her lupus flares. She has had problems with her kidneys in the past but at this time does not know of any chronic kidney disease. She has had some urinary frequency but no dysuria or urgency. She denies any vaginal discharge or bleeding. LMP was January 1. Patient denies any fever but has had nausea which she relates to the pain. She has no abdominal pain. Pain is a 10 out of 10 and sharp in nature. It does not radiate.   The history is provided by the patient.  Back Pain   This is a recurrent problem.    Past Medical History:  Diagnosis Date  . Lupus     There are no active problems to display for this patient.   Past Surgical History:  Procedure Laterality Date  . CHOLECYSTECTOMY      OB History    Gravida Para Term Preterm AB Living   1             SAB TAB Ectopic Multiple Live Births                   Home Medications    Prior to Admission medications   Medication Sig Start Date End Date Taking? Authorizing Provider  acetaminophen (TYLENOL) 500 MG tablet Take 500-1,000 mg by mouth every 6 (six) hours as needed for moderate pain or headache.    Historical Provider, MD  calcium carbonate (OS-CAL - DOSED IN MG OF ELEMENTAL CALCIUM) 1250 (500 Ca) MG tablet Take 1 tablet by mouth daily with breakfast.    Historical Provider, MD  clotrimazole (LOTRIMIN) 1 % cream Apply to affected area 2 times daily Patient not taking: Reported on 01/24/2016 12/14/15   Azalia Bilis, MD  diphenhydramine-acetaminophen (TYLENOL PM) 25-500 MG TABS tablet Take 1-2 tablets by mouth at bedtime as needed  (pain).    Historical Provider, MD  lisinopril (PRINIVIL,ZESTRIL) 5 MG tablet Take 5 mg by mouth daily. 10/27/15   Historical Provider, MD  metroNIDAZOLE (FLAGYL) 500 MG tablet Take 1 tablet (500 mg total) by mouth 2 (two) times daily. Patient not taking: Reported on 01/24/2016 12/09/15   Samantha Tripp Dowless, PA-C  MONONESSA 0.25-35 MG-MCG tablet Take 1 tablet by mouth daily. 09/30/15   Historical Provider, MD    Family History History reviewed. No pertinent family history.  Social History Social History  Substance Use Topics  . Smoking status: Never Smoker  . Smokeless tobacco: Never Used  . Alcohol use No     Allergies   Patient has no known allergies.   Review of Systems Review of Systems  Musculoskeletal: Positive for back pain.  All other systems reviewed and are negative.    Physical Exam Updated Vital Signs BP 143/79 (BP Location: Left Arm)   Pulse 94   Temp 98.8 F (37.1 C) (Oral)   Resp 18   LMP 10/21/2015 (Exact Date)   SpO2 98%   Physical Exam  Constitutional: She is oriented to person, place, and time. She appears well-developed and well-nourished. No distress.  HENT:  Head:  Normocephalic and atraumatic.  Mouth/Throat: Oropharynx is clear and moist.  Eyes: Conjunctivae and EOM are normal. Pupils are equal, round, and reactive to light.  Neck: Normal range of motion. Neck supple.  Cardiovascular: Normal rate, regular rhythm and intact distal pulses.   No murmur heard. Pulmonary/Chest: Effort normal and breath sounds normal. No respiratory distress. She has no wheezes. She has no rales.  Abdominal: Soft. She exhibits no distension. There is no tenderness. There is no rebound and no guarding.  Musculoskeletal: Normal range of motion. She exhibits tenderness. She exhibits no edema.       Lumbar back: She exhibits tenderness and pain. She exhibits normal range of motion and no bony tenderness.  Neurological: She is alert and oriented to person, place, and  time.  Patient is able to stand bend and twist without any difficulty. Ambulating normally.  Skin: Skin is warm and dry. No rash noted. No erythema.  Psychiatric: She has a normal mood and affect. Her behavior is normal.  Nursing note and vitals reviewed.    ED Treatments / Results  Labs (all labs ordered are listed, but only abnormal results are displayed) Labs Reviewed  URINALYSIS, ROUTINE W REFLEX MICROSCOPIC - Abnormal; Notable for the following:       Result Value   APPearance HAZY (*)    Protein, ur 100 (*)    Bacteria, UA RARE (*)    Squamous Epithelial / LPF 6-30 (*)    All other components within normal limits  CBC WITH DIFFERENTIAL/PLATELET  BASIC METABOLIC PANEL  POC URINE PREG, ED    EKG  EKG Interpretation None       Radiology No results found.  Procedures Procedures (including critical care time)  Medications Ordered in ED Medications - No data to display   Initial Impression / Assessment and Plan / ED Course  I have reviewed the triage vital signs and the nursing notes.  Pertinent labs & imaging results that were available during my care of the patient were reviewed by me and considered in my medical decision making (see chart for details).    Patient is a 26 year old female with a history of lupus presenting today with lower back pain. Concern for potential urinary source versus lupus flare and will rule out acute kidney injury. Patient denies any infectious symptoms and low suspicion for GU pathology such as PID, STD or pregnancy. CBC, BMP, UA and UPT pending  6:19 PM Labs without acute findings.  Will d/c pt home with pain meds and pcp f/u in 10 days Final Clinical Impressions(s) / ED Diagnoses   Final diagnoses:  Acute bilateral low back pain without sciatica  Systemic lupus erythematosus, unspecified SLE type, unspecified organ involvement status (HCC)    New Prescriptions New Prescriptions   HYDROCODONE-ACETAMINOPHEN (NORCO/VICODIN)  5-325 MG TABLET    Take 1-2 tablets by mouth every 6 (six) hours as needed for severe pain.   ONDANSETRON (ZOFRAN ODT) 4 MG DISINTEGRATING TABLET    Take 1 tablet (4 mg total) by mouth every 8 (eight) hours as needed for nausea or vomiting.     Gwyneth SproutWhitney Christalynn Boise, MD 06/08/16 [phone]

## 2016-06-19 ENCOUNTER — Encounter (HOSPITAL_COMMUNITY): Payer: Self-pay

## 2016-06-19 NOTE — ED Triage Notes (Signed)
Pt complaining of boils on L gluteal fold, pt states hx of same. Pt also complaining of R knee pain and swelling. Pt states painful to sit. Pt ambulatory at triage.

## 2016-06-20 ENCOUNTER — Encounter (HOSPITAL_COMMUNITY): Payer: Self-pay

## 2016-06-20 ENCOUNTER — Emergency Department (HOSPITAL_COMMUNITY)
Admission: EM | Admit: 2016-06-20 | Discharge: 2016-06-20 | Disposition: A | Discharge status: Home / Self Care | Payer: Medicaid Other | Source: Home / Self Care | Attending: Emergency Medicine | Admitting: Emergency Medicine

## 2016-06-20 ENCOUNTER — Emergency Department (HOSPITAL_COMMUNITY)
Admission: EM | Admit: 2016-06-20 | Discharge: 2016-06-20 | Disposition: A | Discharge status: Home / Self Care | Payer: Medicaid Other | Attending: Emergency Medicine | Admitting: Emergency Medicine

## 2016-06-20 DIAGNOSIS — L0291 Cutaneous abscess, unspecified: Secondary | ICD-10-CM

## 2016-06-20 DIAGNOSIS — L0231 Cutaneous abscess of buttock: Secondary | ICD-10-CM | POA: Insufficient documentation

## 2016-06-20 MED ORDER — LIDOCAINE HCL (PF) 1 % IJ SOLN: 5.0000 mL | Freq: Once | INTRAMUSCULAR | Status: DC

## 2016-06-20 MED ORDER — DOXYCYCLINE HYCLATE 100 MG PO CAPS: 100.0000 mg | ORAL_CAPSULE | Freq: Two times a day (BID) | ORAL | 0 refills | Status: DC

## 2016-06-20 MED ORDER — LIDOCAINE HCL 1 % IJ SOLN
INTRAMUSCULAR | Status: AC
  Administered 2016-06-20: 20 mL
  Filled 2016-06-20: qty 20

## 2016-06-20 NOTE — ED Provider Notes (Signed)
[facility]-EMERGENCY DEPT Provider Note   CSN: [id number] Arrival date & time: 06/20/16  1128   By signing my name below, I, Soijett Blue, attest that this documentation has been prepared under the direction and in the presence of Sharilyn Sites, PA-C Electronically Signed: Soijett Blue, ED Scribe. 06/20/16. 1:35 PM.  History   Chief Complaint Chief Complaint  Patient presents with  . Abscess    HPI Kristina Vincent is a 26 y.o. female with a PMHx of lupus, who presents to the Emergency Department complaining of gradually worsening abscess to left gluteal fold onset 3 days ago. Pt notes that she hasn't had abscesses to the affected area in the past. She has tried warm compresses/soaks without medications for the relief of her symptoms. She denies fever, chills, drainage, and any other symptoms. Denies allergies to medications.    The history is provided by the patient. No language interpreter was used.    Past Medical History:  Diagnosis Date  . Lupus     There are no active problems to display for this patient.   Past Surgical History:  Procedure Laterality Date  . CHOLECYSTECTOMY      OB History    Gravida Para Term Preterm AB Living   1             SAB TAB Ectopic Multiple Live Births                   Home Medications    Prior to Admission medications   Medication Sig Start Date End Date Taking? Authorizing Provider  acetaminophen (TYLENOL) 500 MG tablet Take 500-1,000 mg by mouth every 6 (six) hours as needed for moderate pain or headache.    Historical Provider, MD  calcium carbonate (OS-CAL - DOSED IN MG OF ELEMENTAL CALCIUM) 1250 (500 Ca) MG tablet Take 1 tablet by mouth daily with breakfast.    Historical Provider, MD  clotrimazole (LOTRIMIN) 1 % cream Apply to affected area 2 times daily Patient not taking: Reported on 01/24/2016 12/14/15   Azalia Bilis, MD  diphenhydramine-acetaminophen (TYLENOL PM) 25-500 MG TABS tablet Take 1-2 tablets by mouth at bedtime as  needed (pain).    Historical Provider, MD  HYDROcodone-acetaminophen (NORCO/VICODIN) 5-325 MG tablet Take 1-2 tablets by mouth every 6 (six) hours as needed for severe pain. 06/08/16   Gwyneth Sprout, MD  lisinopril (PRINIVIL,ZESTRIL) 5 MG tablet Take 5 mg by mouth daily. 10/27/15   Historical Provider, MD  metroNIDAZOLE (FLAGYL) 500 MG tablet Take 1 tablet (500 mg total) by mouth 2 (two) times daily. Patient not taking: Reported on 01/24/2016 12/09/15   Samantha Tripp Dowless, PA-C  MONONESSA 0.25-35 MG-MCG tablet Take 1 tablet by mouth daily. 09/30/15   Historical Provider, MD  ondansetron (ZOFRAN ODT) 4 MG disintegrating tablet Take 1 tablet (4 mg total) by mouth every 8 (eight) hours as needed for nausea or vomiting. 06/08/16   Gwyneth Sprout, MD    Family History History reviewed. No pertinent family history.  Social History Social History  Substance Use Topics  . Smoking status: Never Smoker  . Smokeless tobacco: Never Used  . Alcohol use No     Allergies   Patient has no known allergies.   Review of Systems Review of Systems  Constitutional: Negative for chills and fever.  Skin: Negative for color change.       +abscess to left gluteal fold without drainage.  All other systems reviewed and are negative.    Physical Exam Updated Vital  Signs BP 121/63   Pulse 85   Temp 98.8 F (37.1 C)   Resp 16   Ht 5\' 3"  (1.6 m)   Wt 230 lb (104.3 kg)   LMP 06/19/2016 (Exact Date)   SpO2 99%   BMI 40.74 kg/m   Physical Exam  Constitutional: She is oriented to person, place, and time. She appears well-developed and well-nourished.  HENT:  Head: Normocephalic and atraumatic.  Mouth/Throat: Oropharynx is clear and moist.  Eyes: Conjunctivae and EOM are normal. Pupils are equal, round, and reactive to light.  Neck: Normal range of motion.  Cardiovascular: Normal rate, regular rhythm and normal heart sounds.   Pulmonary/Chest: Effort normal and breath sounds normal.  Abdominal:  Soft. Bowel sounds are normal.  Musculoskeletal: Normal range of motion.  Neurological: She is alert and oriented to person, place, and time.  Skin: Skin is warm and dry. There is erythema.  Small 2 cm abscess of left gluteal fold with central fluctuance. Mild amount of surrounding erythema without induration or cellulitis.   Psychiatric: She has a normal mood and affect.  Nursing note and vitals reviewed.    ED Treatments / Results  DIAGNOSTIC STUDIES: Oxygen Saturation is 99% on RA, nl by my interpretation.    COORDINATION OF CARE: 1:17 PM Discussed treatment plan with pt at bedside which includes I&D, abx Rx, and pt agreed to plan.   Procedures .Marland Kitchen.Incision and Drainage Date/Time: 06/20/2016 1:51 PM Performed by: Garlon HatchetSANDERS, Jenilyn Magana M Authorized by: Garlon HatchetSANDERS, Estevon Fluke M   Consent:    Consent obtained:  Verbal   Consent given by:  Patient   Risks discussed:  Incomplete drainage and pain Location:    Type:  Abscess   Size:  2 cm   Location:  Anogenital   Anogenital location: gluteal fold. Pre-procedure details:    Skin preparation:  Antiseptic wash Anesthesia (see MAR for exact dosages):    Anesthesia method:  Local infiltration   Local anesthetic:  Lidocaine 1% w/o epi (2 ml used) Procedure type:    Complexity:  Simple Procedure details:    Needle aspiration: no     Incision types:  Stab incision   Incision depth:  Dermal   Scalpel blade:  11   Wound management:  Probed and deloculated and irrigated with saline   Drainage:  Purulent   Drainage amount:  Moderate   Wound treatment:  Wound left open   Packing materials:  None Post-procedure details:    Patient tolerance of procedure:  Tolerated well, no immediate complications     (including critical care time)  Medications Ordered in ED Medications - No data to display   Initial Impression / Assessment and Plan / ED Course  I have reviewed the triage vital signs and the nursing notes.  26 year old female here with  abscess to left gluteal fold for the past several days. She is afebrile and nontoxic. Abscess is fluctuant on exam without active drainage. I&D performed, she tolerated well. Patient reports history of recurrent abscesses in this area, will start on course of doxycycline. Encouraged her to continue warm soaks at home. I recommended that she follow-up closely with her primary care doctor.  Discussed plan with patient, she acknowledged understanding and agreed with plan of care.  Return precautions given for new or worsening symptoms.  Final Clinical Impressions(s) / ED Diagnoses   Final diagnoses:  Abscess    New Prescriptions New Prescriptions   DOXYCYCLINE (VIBRAMYCIN) 100 MG CAPSULE    Take 1 capsule (100  mg total) by mouth 2 (two) times daily.   I personally performed the services described in this documentation, which was scribed in my presence. The recorded information has been reviewed and is accurate.    Garlon Hatchet, PA-C 06/20/16 1435    Gwyneth Sprout, MD 06/21/16 2129

## 2016-06-20 NOTE — Discharge Instructions (Signed)
Take the prescribed medication as directed.  Keep area clean with soap and warm water at home. Follow-up with your primary care doctor. Return to the ED for new or worsening symptoms.

## 2016-06-20 NOTE — ED Triage Notes (Signed)
PT C/O AN ABSCESS TO THE LEFT GLUTEAL FOLD X3 DAYS. DENIES DRAINAGE OR FEVER.

## 2016-07-03 ENCOUNTER — Emergency Department (HOSPITAL_COMMUNITY)
Admission: EM | Admit: 2016-07-03 | Discharge: 2016-07-03 | Disposition: A | Discharge status: Home / Self Care | Payer: Medicaid Other | Attending: Emergency Medicine | Admitting: Emergency Medicine

## 2016-07-03 DIAGNOSIS — B9689 Other specified bacterial agents as the cause of diseases classified elsewhere: Secondary | ICD-10-CM | POA: Insufficient documentation

## 2016-07-03 DIAGNOSIS — N899 Noninflammatory disorder of vagina, unspecified: Secondary | ICD-10-CM | POA: Diagnosis present

## 2016-07-03 DIAGNOSIS — N760 Acute vaginitis: Secondary | ICD-10-CM | POA: Diagnosis not present

## 2016-07-03 DIAGNOSIS — E876 Hypokalemia: Secondary | ICD-10-CM | POA: Insufficient documentation

## 2016-07-03 DIAGNOSIS — R1084 Generalized abdominal pain: Secondary | ICD-10-CM

## 2016-07-03 LAB — URINALYSIS, ROUTINE W REFLEX MICROSCOPIC
BILIRUBIN URINE: NEGATIVE
Bacteria, UA: NONE SEEN
Glucose, UA: NEGATIVE mg/dL
Hgb urine dipstick: NEGATIVE
Ketones, ur: NEGATIVE mg/dL
Leukocytes, UA: NEGATIVE
Nitrite: NEGATIVE
Protein, ur: 100 mg/dL — AB
SPECIFIC GRAVITY, URINE: 1.015 (ref 1.005–1.030)
pH: 6 (ref 5.0–8.0)

## 2016-07-03 LAB — COMPREHENSIVE METABOLIC PANEL
ALK PHOS: 75 U/L (ref 38–126)
ALT: 20 U/L (ref 14–54)
AST: 19 U/L (ref 15–41)
Albumin: 3.6 g/dL (ref 3.5–5.0)
Anion gap: 8 (ref 5–15)
BUN: 9 mg/dL (ref 6–20)
CHLORIDE: 108 mmol/L (ref 101–111)
CO2: 24 mmol/L (ref 22–32)
CREATININE: 0.77 mg/dL (ref 0.44–1.00)
Calcium: 9.2 mg/dL (ref 8.9–10.3)
GFR calc Af Amer: >60 mL/min (ref >60)
GFR calc non Af Amer: >60 mL/min (ref >60)
GLUCOSE: 103 mg/dL — AB (ref 65–99)
Potassium: 3.4 mmol/L — ABNORMAL LOW (ref 3.5–5.1)
SODIUM: 140 mmol/L (ref 135–145)
Total Bilirubin: 0.9 mg/dL (ref 0.3–1.2)
Total Protein: 7.5 g/dL (ref 6.5–8.1)

## 2016-07-03 LAB — I-STAT BETA HCG BLOOD, ED (MC, WL, AP ONLY)

## 2016-07-03 LAB — CBC
HCT: 38.7 % (ref 36.0–46.0)
Hemoglobin: 12.7 g/dL (ref 12.0–15.0)
MCH: 27 pg (ref 26.0–34.0)
MCHC: 32.8 g/dL (ref 30.0–36.0)
MCV: 82.3 fL (ref 78.0–100.0)
PLATELETS: 284 10*3/uL (ref 150–400)
RBC: 4.7 MIL/uL (ref 3.87–5.11)
RDW: 12.9 % (ref 11.5–15.5)
WBC: 8.3 10*3/uL (ref 4.0–10.5)

## 2016-07-03 LAB — LIPASE, BLOOD: LIPASE: 17 U/L (ref 11–51)

## 2016-07-03 LAB — WET PREP, GENITAL
Sperm: NONE SEEN
Trich, Wet Prep: NONE SEEN
Yeast Wet Prep HPF POC: NONE SEEN

## 2016-07-03 MED ORDER — AZITHROMYCIN 250 MG PO TABS
1000.0000 mg | ORAL_TABLET | Freq: Once | ORAL | Status: AC
  Administered 2016-07-03: 1000 mg via ORAL
  Filled 2016-07-03: qty 4

## 2016-07-03 MED ORDER — STERILE WATER FOR INJECTION IJ SOLN
INTRAMUSCULAR | Status: AC
  Filled 2016-07-03: qty 20

## 2016-07-03 MED ORDER — POTASSIUM CHLORIDE CRYS ER 20 MEQ PO TBCR
40.0000 meq | EXTENDED_RELEASE_TABLET | Freq: Once | ORAL | Status: AC
  Administered 2016-07-03: 40 meq via ORAL
  Filled 2016-07-03: qty 2

## 2016-07-03 MED ORDER — METRONIDAZOLE 500 MG PO TABS: 500.0000 mg | ORAL_TABLET | Freq: Two times a day (BID) | ORAL | 0 refills | Status: DC

## 2016-07-03 MED ORDER — CEFTRIAXONE SODIUM 250 MG IJ SOLR
250.0000 mg | Freq: Once | INTRAMUSCULAR | Status: AC
  Administered 2016-07-03: 250 mg via INTRAMUSCULAR
  Filled 2016-07-03: qty 250

## 2016-07-03 NOTE — ED Provider Notes (Signed)
[facility]-EMERGENCY DEPT Provider Note   CSN: [id number] Arrival date & time: 07/03/16  1303     History   Chief Complaint Chief Complaint  Patient presents with  . Abdominal Pain  . Nausea  . Vaginal Discharge    HPI Kristina Vincent is a 26 y.o. female.Plains of diffuse abdominal pain for the past 3 days pain is intermittent lasting 30 minutes at a time goes away for approximately 2 hours at a time. It is improved with lying supine and made worse with sitting upright. Associates symptoms include nausea. No vomiting. No fever. Last bowel movement yesterday, normal. Treated himself with Tylenol, without relief. Other associated symptoms include vaginal discharge for 1 day. Patient is concerned that she might have a sexually transmitted disease  HPI  Past Medical History:  Diagnosis Date  . Lupus     There are no active problems to display for this patient.   Past Surgical History:  Procedure Laterality Date  . CHOLECYSTECTOMY      OB History    Gravida Para Term Preterm AB Living   1             SAB TAB Ectopic Multiple Live Births                   Home Medications    Prior to Admission medications   Medication Sig Start Date End Date Taking? Authorizing Provider  acetaminophen (TYLENOL) 500 MG tablet Take 500-1,000 mg by mouth every 6 (six) hours as needed for moderate pain or headache.    Historical Provider, MD  calcium carbonate (OS-CAL - DOSED IN MG OF ELEMENTAL CALCIUM) 1250 (500 Ca) MG tablet Take 1 tablet by mouth daily with breakfast.    Historical Provider, MD  clotrimazole (LOTRIMIN) 1 % cream Apply to affected area 2 times daily Patient not taking: Reported on 01/24/2016 12/14/15   Azalia Bilis, MD  diphenhydramine-acetaminophen (TYLENOL PM) 25-500 MG TABS tablet Take 1-2 tablets by mouth at bedtime as needed (pain).    Historical Provider, MD  doxycycline (VIBRAMYCIN) 100 MG capsule Take 1 capsule (100 mg total) by mouth 2 (two) times daily. 06/20/16   Garlon Hatchet, PA-C  HYDROcodone-acetaminophen (NORCO/VICODIN) 5-325 MG tablet Take 1-2 tablets by mouth every 6 (six) hours as needed for severe pain. 06/08/16   Gwyneth Sprout, MD  lisinopril (PRINIVIL,ZESTRIL) 5 MG tablet Take 5 mg by mouth daily. 10/27/15   Historical Provider, MD  metroNIDAZOLE (FLAGYL) 500 MG tablet Take 1 tablet (500 mg total) by mouth 2 (two) times daily. Patient not taking: Reported on 01/24/2016 12/09/15   Samantha Tripp Dowless, PA-C  MONONESSA 0.25-35 MG-MCG tablet Take 1 tablet by mouth daily. 09/30/15   Historical Provider, MD  ondansetron (ZOFRAN ODT) 4 MG disintegrating tablet Take 1 tablet (4 mg total) by mouth every 8 (eight) hours as needed for nausea or vomiting. 06/08/16   Gwyneth Sprout, MD    Family History No family history on file.  Social History Social History  Substance Use Topics  . Smoking status: Never Smoker  . Smokeless tobacco: Never Used  . Alcohol use No   Occasional alcohol use no illicit drug use  Allergies   Patient has no known allergies.   Review of Systems Review of Systems  Constitutional: Negative.   HENT: Negative.   Respiratory: Negative.   Cardiovascular: Negative.   Gastrointestinal: Positive for abdominal pain and nausea.  Genitourinary: Positive for vaginal discharge.       Irregular menses  Musculoskeletal: Negative.   Skin: Negative.   Neurological: Negative.   Psychiatric/Behavioral: Negative.   All other systems reviewed and are negative.    Physical Exam Updated Vital Signs BP 167/93 (BP Location: Left Arm)   Pulse 98   Temp 98.8 F (37.1 C) (Oral)   Resp 18   LMP 06/19/2016 (Exact Date)   SpO2 100%   Physical Exam  Constitutional: She appears well-developed and well-nourished.  HENT:  Head: Normocephalic and atraumatic.  Eyes: Conjunctivae are normal. Pupils are equal, round, and reactive to light.  Neck: Neck supple. No tracheal deviation present. No thyromegaly present.  Cardiovascular: Normal  rate and regular rhythm.   No murmur heard. Pulmonary/Chest: Effort normal and breath sounds normal.  Abdominal: Soft. Bowel sounds are normal. She exhibits no distension. There is no tenderness.  Obese  Genitourinary:  Genitourinary Comments: Pelvic exam no external lesion. Yellowish-white vaginal discharge. Cervical os closed. No cervical motion tenderness no adnexal masses or tenderness  Musculoskeletal: Normal range of motion. She exhibits no edema or tenderness.  Neurological: She is alert. Coordination normal.  Skin: Skin is warm and dry. No rash noted.  Psychiatric: She has a normal mood and affect.  Nursing note and vitals reviewed.    ED Treatments / Results  Labs (all labs ordered are listed, but only abnormal results are displayed) Labs Reviewed  COMPREHENSIVE METABOLIC PANEL - Abnormal; Notable for the following:       Result Value   Potassium 3.4 (*)    Glucose, Bld 103 (*)    All other components within normal limits  URINALYSIS, ROUTINE W REFLEX MICROSCOPIC - Abnormal; Notable for the following:    Protein, ur 100 (*)    Squamous Epithelial / LPF 0-5 (*)    All other components within normal limits  WET PREP, GENITAL  LIPASE, BLOOD  CBC  RPR  HIV ANTIBODY (ROUTINE TESTING)  I-STAT BETA HCG BLOOD, ED ([facility], [facility], [facility] ONLY)  GC/CHLAMYDIA PROBE AMP ([place]) NOT AT [facility]    EKG  EKG Interpretation None      Results for orders placed or performed during the hospital encounter of 07/03/16  Wet prep, genital  Result Value Ref Range   Yeast Wet Prep HPF POC NONE SEEN NONE SEEN   Trich, Wet Prep NONE SEEN NONE SEEN   Clue Cells Wet Prep HPF POC PRESENT (A) NONE SEEN   WBC, Wet Prep HPF POC FEW (A) NONE SEEN   Sperm NONE SEEN   Lipase, blood  Result Value Ref Range   Lipase 17 11 - 51 U/L  Comprehensive metabolic panel  Result Value Ref Range   Sodium 140 135 - 145 mmol/L   Potassium 3.4 (L) 3.5 - 5.1 mmol/L   Chloride 108 101 - 111 mmol/L   CO2 24 22 -  32 mmol/L   Glucose, Bld 103 (H) 65 - 99 mg/dL   BUN 9 6 - 20 mg/dL   Creatinine, Ser 4.09 0.44 - 1.00 mg/dL   Calcium 9.2 8.9 - 81.1 mg/dL   Total Protein 7.5 6.5 - 8.1 g/dL   Albumin 3.6 3.5 - 5.0 g/dL   AST 19 15 - 41 U/L   ALT 20 14 - 54 U/L   Alkaline Phosphatase 75 38 - 126 U/L   Total Bilirubin 0.9 0.3 - 1.2 mg/dL   GFR calc non Af Amer >60 >60 mL/min   GFR calc Af Amer >60 >60 mL/min   Anion gap 8 5 - 15  CBC  Result Value Ref Range   WBC 8.3 4.0 - 10.5 K/uL   RBC 4.70 3.87 - 5.11 MIL/uL   Hemoglobin 12.7 12.0 - 15.0 g/dL   HCT 40.938.7 81.136.0 - 91.446.0 %   MCV 82.3 78.0 - 100.0 fL   MCH 27.0 26.0 - 34.0 pg   MCHC 32.8 30.0 - 36.0 g/dL   RDW 78.212.9 95.611.5 - 21.315.5 %   Platelets 284 150 - 400 K/uL  Urinalysis, Routine w reflex microscopic  Result Value Ref Range   Color, Urine YELLOW YELLOW   APPearance CLEAR CLEAR   Specific Gravity, Urine 1.015 1.005 - 1.030   pH 6.0 5.0 - 8.0   Glucose, UA NEGATIVE NEGATIVE mg/dL   Hgb urine dipstick NEGATIVE NEGATIVE   Bilirubin Urine NEGATIVE NEGATIVE   Ketones, ur NEGATIVE NEGATIVE mg/dL   Protein, ur 086100 (A) NEGATIVE mg/dL   Nitrite NEGATIVE NEGATIVE   Leukocytes, UA NEGATIVE NEGATIVE   RBC / HPF 0-5 0 - 5 RBC/hpf   WBC, UA 0-5 0 - 5 WBC/hpf   Bacteria, UA NONE SEEN NONE SEEN   Squamous Epithelial / LPF 0-5 (A) NONE SEEN   Mucous PRESENT    Hyaline Casts, UA PRESENT   I-Stat beta hCG blood, ED  Result Value Ref Range   I-stat hCG, quantitative <5.0 <5 mIU/mL   Comment 3           No results found. Radiology No results found.  Procedures Procedures (including critical care time)  Medications Ordered in ED Medications  cefTRIAXone (ROCEPHIN) injection 250 mg (not administered)  azithromycin (ZITHROMAX) tablet 1,000 mg (not administered)     Initial Impression / Assessment and Plan / ED Course  I have reviewed the triage vital signs and the nursing notes.  Pertinent labs & imaging results that were available during my  care of the patient were reviewed by me and considered in my medical decision making (see chart for details).     Patient feels well after treatment with oral Zithromax and IM ceftriaxone. Treated empirically for STDs. Plan safe sex encouraged. Prescription Flagyl. Referral women's outpatient clinic  Final Clinical Impressions(s) / ED Diagnoses  Diagnoses #1 generalized abdominal pain #2 bacterial vaginosis Final diagnoses:  None  #3Hypokalemia  New Prescriptions New Prescriptions   No medications on file     Doug SouSam Nakita Santerre, MD 07/03/16 1928

## 2016-07-03 NOTE — ED Notes (Signed)
Patient verbalizes understanding of discharge instructions, prescriptions, home care and follow up care. Patient out of department at this time. 

## 2016-07-03 NOTE — ED Notes (Signed)
Patient requested to have labs for STDs drawn. There are 3 gold tops in main lab.

## 2016-07-03 NOTE — Discharge Instructions (Signed)
Use a condom each time you have sex. Call the women's outpatient clinic to schedule a follow-up appointment if you're not feeling better in a few days. They can also acted your regular gynecologist. Call the number on your chart to get a primary care physician. Tylenol as directed for pain

## 2016-07-03 NOTE — ED Triage Notes (Signed)
Patient c/o generalized abd pain and nausea in mornigs x couple days. Patient reports white vaginal discharge that she noticed today and would like to be checked for STDs.

## 2016-07-04 LAB — GC/CHLAMYDIA PROBE AMP (~~LOC~~) NOT AT ARMC
Chlamydia: NEGATIVE
Neisseria Gonorrhea: NEGATIVE

## 2016-07-05 LAB — RPR, QUANT+TP ABS (REFLEX)
Rapid Plasma Reagin, Quant: 1:1 {titer} — ABNORMAL HIGH
TREPONEMA PALLIDUM AB: NEGATIVE

## 2016-07-05 LAB — HIV ANTIBODY (ROUTINE TESTING W REFLEX): HIV Screen 4th Generation wRfx: NONREACTIVE

## 2016-07-05 LAB — RPR: RPR: REACTIVE — AB

## 2016-08-15 ENCOUNTER — Emergency Department (HOSPITAL_COMMUNITY)
Admission: EM | Admit: 2016-08-15 | Discharge: 2016-08-15 | Disposition: A | Discharge status: Home / Self Care | Payer: Medicaid Other | Attending: Emergency Medicine | Admitting: Emergency Medicine

## 2016-08-15 ENCOUNTER — Encounter (HOSPITAL_COMMUNITY): Payer: Self-pay | Admitting: Emergency Medicine

## 2016-08-15 DIAGNOSIS — G8929 Other chronic pain: Secondary | ICD-10-CM | POA: Diagnosis not present

## 2016-08-15 DIAGNOSIS — M545 Low back pain: Secondary | ICD-10-CM | POA: Insufficient documentation

## 2016-08-15 DIAGNOSIS — Z79899 Other long term (current) drug therapy: Secondary | ICD-10-CM | POA: Diagnosis not present

## 2016-08-15 DIAGNOSIS — R0789 Other chest pain: Secondary | ICD-10-CM | POA: Insufficient documentation

## 2016-08-15 LAB — I-STAT CHEM 8, ED
BUN: 12 mg/dL (ref 6–20)
CALCIUM ION: 1.25 mmol/L (ref 1.15–1.40)
CHLORIDE: 105 mmol/L (ref 101–111)
Creatinine, Ser: 0.9 mg/dL (ref 0.44–1.00)
GLUCOSE: 92 mg/dL (ref 65–99)
HCT: 34 % — ABNORMAL LOW (ref 36.0–46.0)
Hemoglobin: 11.6 g/dL — ABNORMAL LOW (ref 12.0–15.0)
Potassium: 4.2 mmol/L (ref 3.5–5.1)
SODIUM: 141 mmol/L (ref 135–145)
TCO2: 27 mmol/L (ref 0–100)

## 2016-08-15 LAB — URINALYSIS, ROUTINE W REFLEX MICROSCOPIC
BILIRUBIN URINE: NEGATIVE
GLUCOSE, UA: NEGATIVE mg/dL
Ketones, ur: NEGATIVE mg/dL
NITRITE: NEGATIVE
PROTEIN: 100 mg/dL — AB
SPECIFIC GRAVITY, URINE: 1.02 (ref 1.005–1.030)
pH: 6 (ref 5.0–8.0)

## 2016-08-15 MED ORDER — OXYCODONE-ACETAMINOPHEN 5-325 MG PO TABS: 1.0000 | ORAL_TABLET | Freq: Four times a day (QID) | ORAL | 0 refills | Status: DC | PRN

## 2016-08-15 NOTE — ED Triage Notes (Signed)
Pt reports feeling like she is having a lupus flair that started this morning. Pt reporting pain in lower back and into legs that was unrelieved with rest or Tylenol that was taken at 1100.

## 2016-08-15 NOTE — ED Provider Notes (Signed)
[facility]-EMERGENCY DEPT Provider Note   By signing my name below, I, Earmon PhoenixJennifer Waddell, attest that this documentation has been prepared under the direction and in the presence of Elpidio AnisShari Omie Ferger, PA-C. Electronically Signed: Earmon PhoenixJennifer Waddell, ED Scribe. 08/15/16. 10:21 PM.    History   Chief Complaint Chief Complaint  Patient presents with  . Back Pain   The history is provided by the patient and medical records. No language interpreter was used.    Kristina Vincent is an obese 26 y.o. female with PMHx of lupus who presents to the Emergency Department complaining of lower and upper back pain that began earlier this morning. She reports associated radiating pain into her chest. She states the lower back pain is similar to her normal lupus flares. Pt voices concern about her kidneys stating she had a kidney biopsy when she was first diagnosed with lupus. She has taken Tylenol for pain with no significant relief. Lying in certain positions increases the back pain. She denies alleviating factors. She denies SOB, fever, chills, abdominal pain, nausea, vomiting or urinary symptoms. [place] in [place] and she is in the process of transferring to a provider in [place]. She last saw her PCP two months ago. She denies cardiac problems.   Past Medical History:  Diagnosis Date  . Lupus     There are no active problems to display for this patient.   Past Surgical History:  Procedure Laterality Date  . CHOLECYSTECTOMY      OB History    Gravida Para Term Preterm AB Living   1             SAB TAB Ectopic Multiple Live Births                   Home Medications    Prior to Admission medications   Medication Sig Start Date End Date Taking? Authorizing Provider  acetaminophen (TYLENOL) 500 MG tablet Take 500-1,000 mg by mouth every 6 (six) hours as needed for moderate pain or headache.   Yes Historical Provider, MD  acetaminophen (TYLENOL) 650 MG CR tablet Take 1,300 mg  by mouth every 8 (eight) hours as needed for pain.   Yes Historical Provider, MD  calcium carbonate (OS-CAL - DOSED IN MG OF ELEMENTAL CALCIUM) 1250 (500 Ca) MG tablet Take 1 tablet by mouth daily with breakfast.   Yes Historical Provider, MD  lisinopril (PRINIVIL,ZESTRIL) 5 MG tablet Take 5 mg by mouth daily. 10/27/15  Yes Historical Provider, MD  mycophenolate (CELLCEPT) 500 MG tablet Take 2 tablets by mouth 2 (two) times daily. 03/03/16  Yes Historical Provider, MD  ondansetron (ZOFRAN ODT) 4 MG disintegrating tablet Take 1 tablet (4 mg total) by mouth every 8 (eight) hours as needed for nausea or vomiting. 06/08/16  Yes Gwyneth SproutWhitney Plunkett, MD  clotrimazole (LOTRIMIN) 1 % cream Apply to affected area 2 times daily Patient not taking: Reported on 01/24/2016 12/14/15   Azalia BilisKevin Campos, MD  doxycycline (VIBRAMYCIN) 100 MG capsule Take 1 capsule (100 mg total) by mouth 2 (two) times daily. Patient not taking: Reported on 08/15/2016 06/20/16   Garlon HatchetLisa M Sanders, PA-C  HYDROcodone-acetaminophen (NORCO/VICODIN) 5-325 MG tablet Take 1-2 tablets by mouth every 6 (six) hours as needed for severe pain. Patient not taking: Reported on 08/15/2016 06/08/16   Gwyneth SproutWhitney Plunkett, MD  metroNIDAZOLE (FLAGYL) 500 MG tablet Take 1 tablet (500 mg total) by mouth 2 (two) times daily. One po bid x 7 days Patient not taking: Reported on  08/15/2016 07/03/16   Doug Sou, MD    Family History History reviewed. No pertinent family history.  Social History Social History  Substance Use Topics  . Smoking status: Never Smoker  . Smokeless tobacco: Never Used  . Alcohol use No     Allergies   Patient has no known allergies.   Review of Systems Review of Systems  Constitutional: Negative for chills and fever.  Respiratory: Negative for shortness of breath.   Gastrointestinal: Negative for abdominal pain, nausea and vomiting.  Genitourinary: Negative.   Musculoskeletal: Positive for back pain and myalgias.  Neurological:  Negative for weakness and numbness.     Physical Exam Updated Vital Signs BP (!) 143/84 (BP Location: Left Arm)   Pulse 82   Temp 98.9 F (37.2 C) (Oral)   Resp 18   Ht 5\' 3"  (1.6 m)   Wt 230 lb (104.3 kg)   LMP 08/15/2016 (Exact Date)   SpO2 100%   BMI 40.74 kg/m   Physical Exam  Constitutional: She is oriented to person, place, and time. She appears well-developed and well-nourished.  HENT:  Head: Normocephalic and atraumatic.  Neck: Normal range of motion.  Cardiovascular: Normal rate, regular rhythm and normal heart sounds.  Exam reveals no gallop and no friction rub.   No murmur heard. Pulmonary/Chest: Effort normal and breath sounds normal. No respiratory distress. She has no wheezes. She has no rales. She exhibits tenderness.  Abdominal: Soft. There is no tenderness. There is no CVA tenderness.  Musculoskeletal: Normal range of motion.  No reproducible tenderness to lower back.  Neurological: She is alert and oriented to person, place, and time.  Skin: Skin is warm and dry.  Psychiatric: She has a normal mood and affect. Her behavior is normal.  Nursing note and vitals reviewed.    ED Treatments / Results  DIAGNOSTIC STUDIES: Oxygen Saturation is 100% on RA, normal by my interpretation.   COORDINATION OF CARE: 10:08 PM- Will prescribe pain medication. Will order labs and urinalysis. Pt verbalizes understanding and agrees to plan.  Medications - No data to display  Labs (all labs ordered are listed, but only abnormal results are displayed) Labs Reviewed  URINALYSIS, ROUTINE W REFLEX MICROSCOPIC  I-STAT CHEM 8, ED   Results for orders placed or performed during the hospital encounter of 08/15/16  Urinalysis, Routine w reflex microscopic  Result Value Ref Range   Color, Urine YELLOW YELLOW   APPearance HAZY (A) CLEAR   Specific Gravity, Urine 1.020 1.005 - 1.030   pH 6.0 5.0 - 8.0   Glucose, UA NEGATIVE NEGATIVE mg/dL   Hgb urine dipstick LARGE (A)  NEGATIVE   Bilirubin Urine NEGATIVE NEGATIVE   Ketones, ur NEGATIVE NEGATIVE mg/dL   Protein, ur 161 (A) NEGATIVE mg/dL   Nitrite NEGATIVE NEGATIVE   Leukocytes, UA TRACE (A) NEGATIVE   RBC / HPF 0-5 0 - 5 RBC/hpf   WBC, UA 6-30 0 - 5 WBC/hpf   Bacteria, UA RARE (A) NONE SEEN   Squamous Epithelial / LPF 6-30 (A) NONE SEEN   Mucous PRESENT   I-stat Chem 8, ED  Result Value Ref Range   Sodium 141 135 - 145 mmol/L   Potassium 4.2 3.5 - 5.1 mmol/L   Chloride 105 101 - 111 mmol/L   BUN 12 6 - 20 mg/dL   Creatinine, Ser 0.96 0.44 - 1.00 mg/dL   Glucose, Bld 92 65 - 99 mg/dL   Calcium, Ion 0.45 4.09 - 1.40 mmol/L  TCO2 27 0 - 100 mmol/L   Hemoglobin 11.6 (L) 12.0 - 15.0 g/dL   HCT 21.3 (L) 08.6 - 57.8 %    EKG  EKG Interpretation None       Radiology No results found. No results found.  Procedures Procedures (including critical care time)  Medications Ordered in ED Medications - No data to display   Initial Impression / Assessment and Plan / ED Course  I have reviewed the triage vital signs and the nursing notes.  Pertinent labs & imaging results that were available during my care of the patient were reviewed by me and considered in my medical decision making (see chart for details).     The patient presents with musculoskeletal pain, including left upper chest and back. No fever. Normal VS. There is reproducible tenderness to back and chest supporting dx of muscular pain. Labs to evaluate kidney function (concern of patient) are normal. Pain is improved with pain medication. She is felt stable for discharge home.   I personally performed the services described in this documentation, which was scribed in my presence. The recorded information has been reviewed and is accurate.  Final Clinical Impressions(s) / ED Diagnoses   Final diagnoses:  None   1. Back pain 2. Chest wall pain  New Prescriptions New Prescriptions   No medications on file     Elpidio Anis, Cordelia Poche 08/19/16 0035    Arby Barrette, MD 08/20/16 [phone]

## 2016-08-21 ENCOUNTER — Ambulatory Visit: Payer: Medicaid Other | Attending: Internal Medicine | Admitting: Physician Assistant

## 2016-08-21 ENCOUNTER — Encounter: Payer: Self-pay | Admitting: Physician Assistant

## 2016-08-21 VITALS — BP 121/81 | HR 76 | Temp 98.7°F | Resp 16 | Wt 229.4 lb

## 2016-08-21 DIAGNOSIS — Z9049 Acquired absence of other specified parts of digestive tract: Secondary | ICD-10-CM | POA: Diagnosis not present

## 2016-08-21 DIAGNOSIS — L930 Discoid lupus erythematosus: Secondary | ICD-10-CM

## 2016-08-21 DIAGNOSIS — R809 Proteinuria, unspecified: Secondary | ICD-10-CM | POA: Diagnosis not present

## 2016-08-21 DIAGNOSIS — M329 Systemic lupus erythematosus, unspecified: Secondary | ICD-10-CM | POA: Insufficient documentation

## 2016-08-21 DIAGNOSIS — M545 Low back pain: Secondary | ICD-10-CM | POA: Diagnosis not present

## 2016-08-21 MED ORDER — PREDNISONE 20 MG PO TABS: 20.0000 mg | ORAL_TABLET | Freq: Every day | ORAL | 0 refills | Status: DC

## 2016-08-21 MED ORDER — TRAMADOL HCL 50 MG PO TABS: 50.0000 mg | ORAL_TABLET | Freq: Three times a day (TID) | ORAL | 0 refills | Status: DC | PRN

## 2016-08-21 NOTE — Progress Notes (Signed)
Tanda Dass  EAV:[id number]  NWG:[id number]  DOB - Aug 23, 1990  Chief Complaint  Patient presents with  . Follow-up  . Back Pain       Subjective:   Kristina Vincent is a 26 y.o. female here today for establishment of care. Lupus was diagnosed approximately 2 years ago in [place], [place]. She has a history of proteinuria and is on lisinopril and Cellcept. She has not seen anyone in the last 2 months. She is insured. She presented to the emergency department on 08/15/2016 with complaints of back pain. Mostly lower back. Stabbing-like sensation. Nonradiating. Tylenol without relief. Routine labs done in emergency room unremarkable. No imaging done. She was treated with Percocet as needed. He does temporarily relieve her symptoms. It is a deep, stabbing-like pain. Some decrease in range of motion. No trouble urinating. She is wanting to get linked with a rheumatologist locally.   ROS: GEN: denies fever or chills, denies change in weight Skin: denies lesions or rashes HEENT: denies headache, earache, epistaxis, sore throat, or neck pain LUNGS: denies SHOB, dyspnea, PND, orthopnea CV: denies CP or palpitations ABD: denies abd pain, N or V EXT: + muscle spasms or swelling; no pain in lower ext, no weakness NEURO: denies numbness or tingling, denies sz, stroke or TIA  ALLERGIES: No Known Allergies  PAST MEDICAL HISTORY: Past Medical History:  Diagnosis Date  . Lupus     PAST SURGICAL HISTORY: Past Surgical History:  Procedure Laterality Date  . CHOLECYSTECTOMY      MEDICATIONS AT HOME: Prior to Admission medications   Medication Sig Start Date End Date Taking? Authorizing Provider  acetaminophen (TYLENOL) 500 MG tablet Take 500-1,000 mg by mouth every 6 (six) hours as needed for moderate pain or headache.   Yes Historical Provider, MD  acetaminophen (TYLENOL) 650 MG CR tablet Take 1,300 mg by mouth every 8 (eight) hours as needed for pain.   Yes Historical  Provider, MD  calcium carbonate (OS-CAL - DOSED IN MG OF ELEMENTAL CALCIUM) 1250 (500 Ca) MG tablet Take 1 tablet by mouth daily with breakfast.   Yes Historical Provider, MD  lisinopril (PRINIVIL,ZESTRIL) 5 MG tablet Take 5 mg by mouth daily. 10/27/15  Yes Historical Provider, MD  mycophenolate (CELLCEPT) 500 MG tablet Take 2 tablets by mouth 2 (two) times daily. 03/03/16  Yes Historical Provider, MD  oxyCODONE-acetaminophen (PERCOCET/ROXICET) 5-325 MG tablet Take 1 tablet by mouth every 6 (six) hours as needed for severe pain. 08/15/16  Yes Elpidio Anis, PA-C  clotrimazole (LOTRIMIN) 1 % cream Apply to affected area 2 times daily Patient not taking: Reported on 01/24/2016 12/14/15   Azalia Bilis, MD  doxycycline (VIBRAMYCIN) 100 MG capsule Take 1 capsule (100 mg total) by mouth 2 (two) times daily. Patient not taking: Reported on 08/15/2016 06/20/16   Garlon Hatchet, PA-C  HYDROcodone-acetaminophen (NORCO/VICODIN) 5-325 MG tablet Take 1-2 tablets by mouth every 6 (six) hours as needed for severe pain. Patient not taking: Reported on 08/21/2016 06/08/16   Gwyneth Sprout, MD  metroNIDAZOLE (FLAGYL) 500 MG tablet Take 1 tablet (500 mg total) by mouth 2 (two) times daily. One po bid x 7 days Patient not taking: Reported on 08/15/2016 07/03/16   Doug Sou, MD  ondansetron (ZOFRAN ODT) 4 MG disintegrating tablet Take 1 tablet (4 mg total) by mouth every 8 (eight) hours as needed for nausea or vomiting. Patient not taking: Reported on 08/21/2016 06/08/16   Gwyneth Sprout, MD  predniSONE (DELTASONE) 20 MG tablet Take 1 tOaklie Durrettablet (  20 mg total) by mouth daily with breakfast. 08/21/16   Vivianne Master, PA-C  traMADol (ULTRAM) 50 MG tablet Take 1 tablet (50 mg total) by mouth every 8 (eight) hours as needed. 08/21/16   Vivianne Master, PA-C    Fam-noncontributory Social-single, 1 daughter, looking for work, nonsmoker  Objective:   Vitals:   08/21/16 0939  BP: 121/81  Pulse: 76  Resp: 16  Temp: 98.7 F (37.1 C)   TempSrc: Oral  SpO2: 96%  Weight: 229 lb 6.4 oz (104.1 kg)   Exam General appearance : Awake, alert, not in any distress. Speech Clear. Not toxic looking HEENT: Atraumatic and Normocephalic, pupils equally reactive to light and accomodation Neck: supple, no JVD. No cervical lymphadenopathy.  Chest:Good air entry bilaterally, no added sounds  CVS: S1 S2 regular, no murmurs.  Abdomen: Bowel sounds present, Non tender and not distended with no guarding, rigidity or rebound. Extremities: B/L Lower Ext shows no edema, both legs are warm to touch Neurology: Awake alert, and oriented X 3, CN II-XII intact, Non focal Skin:No Rash Wounds:N/A   Assessment & Plan  1. Back pain-lower  -Prednisone  -Ultram 2. SLE  -Rheum referral   -Prednisone   Return in about 2 weeks (around 09/04/2016).  The patient was given clear instructions to go to ER or return to medical center if symptoms don't improve, worsen or new problems develop. The patient verbalized understanding. The patient was told to call to get lab results if they haven't heard anything in the next week.   Total time spent with patient was 18 min. Greater than 50 % of this visit was spent face to face counseling and coordinating care regarding risk factor modification, compliance importance and encouragement, education related to medications and compliance.  This note has been created with Education officer, environmental. Any transcriptional errors are unintentional.    Scot Jun, PA-C [facility] [place], [place] [phone]   08/21/2016, 10:01 AM

## 2016-08-23 ENCOUNTER — Telehealth: Payer: Self-pay

## 2016-08-23 NOTE — Telephone Encounter (Signed)
Pt contacted the office and stated Kristina Vincent said that if she is in pain still to give [place] a call and she will prescribed something stronger. I informed pt that Elmarie Shiley is not in the office today but she will be here tomorrow and so I will send her a message. I informed pt that Kristina Vincent prescribed her tramadol and pt states she did but it is not working. I informed pt that if Kristina Vincent prescribed something stronger we will give her a call to come pick up the script. Pt states she understands.

## 2016-08-24 MED ORDER — OXYCODONE-ACETAMINOPHEN 5-325 MG PO TABS: 1.0000 | ORAL_TABLET | Freq: Four times a day (QID) | ORAL | 0 refills | Status: DC | PRN

## 2016-08-24 NOTE — Addendum Note (Signed)
Addended byVivianne Master on: 08/24/2016 10:47 AM   Modules accepted: Orders

## 2016-08-28 ENCOUNTER — Encounter: Payer: Self-pay | Admitting: Internal Medicine

## 2016-09-03 ENCOUNTER — Emergency Department (HOSPITAL_COMMUNITY)
Admission: EM | Admit: 2016-09-03 | Discharge: 2016-09-03 | Disposition: A | Discharge status: Home / Self Care | Payer: Medicaid Other | Attending: Emergency Medicine | Admitting: Emergency Medicine

## 2016-09-03 ENCOUNTER — Encounter (HOSPITAL_COMMUNITY): Payer: Self-pay | Admitting: Emergency Medicine

## 2016-09-03 DIAGNOSIS — Z79899 Other long term (current) drug therapy: Secondary | ICD-10-CM | POA: Insufficient documentation

## 2016-09-03 DIAGNOSIS — Z3202 Encounter for pregnancy test, result negative: Secondary | ICD-10-CM | POA: Diagnosis not present

## 2016-09-03 DIAGNOSIS — L930 Discoid lupus erythematosus: Secondary | ICD-10-CM

## 2016-09-03 LAB — POC URINE PREG, ED: Preg Test, Ur: NEGATIVE

## 2016-09-03 NOTE — ED Triage Notes (Signed)
Pt. Stated, I have lupus and I might be pregnant and not suppose to be with lupus, so I need a pregnancy test.

## 2016-09-03 NOTE — ED Provider Notes (Signed)
[facility]-EMERGENCY DEPT Provider Note   CSN: [id number] Arrival date & time: 09/03/16  7829    By signing my name below, I, Kristina Vincent, attest that this documentation has been prepared under the direction and in the presence of Lyndel Safe, [place]. Electronically Signed: Valentino Vincent, ED Scribe. 09/03/16. 10:25 AM.  History   Chief Complaint Chief Complaint  Patient presents with  . Lupus  . Possible Pregnancy   The history is provided by the patient. No language interpreter was used.   HPI Comments: Kristina Vincent is a 26 y.o. female with PMHx of Lupus, who presents to the Emergency Department moderate, intermittent, nausea onset two weeks ago. Pt reports associated episodic vomiting and mild headaches. Pt notes her episodal vomiting began earlier today. Pt notes having sharp, intermittent, RUQ abdominal pain. She reports having recent sexual intercourse with no protection. Pt states she's presenting for a pregnancy test. No alleviating factors noted. Pt denies any pain, fevers, chills, SOB, vaginal bleeding, vaginal discharge.   She reports she has been pregnant 7 times in the past but that she "got rid of 4 of them", had two miscarriages and has one living daughter.   Past Medical History:  Diagnosis Date  . Lupus     There are no active problems to display for this patient.   Past Surgical History:  Procedure Laterality Date  . CHOLECYSTECTOMY        Home Medications    Prior to Admission medications   Medication Sig Start Date End Date Taking? Authorizing Provider  acetaminophen (TYLENOL) 500 MG tablet Take 500-1,000 mg by mouth every 6 (six) hours as needed for moderate pain or headache.    Historical Provider, MD  acetaminophen (TYLENOL) 650 MG CR tablet Take 1,300 mg by mouth every 8 (eight) hours as needed for pain.    Historical Provider, MD  calcium carbonate (OS-CAL - DOSED IN MG OF ELEMENTAL CALCIUM) 1250 (500 Ca) MG tablet Take 1 tablet by mouth daily  with breakfast.    Historical Provider, MD  clotrimazole (LOTRIMIN) 1 % cream Apply to affected area 2 times daily Patient not taking: Reported on 01/24/2016 12/14/15   Azalia Bilis, MD  doxycycline (VIBRAMYCIN) 100 MG capsule Take 1 capsule (100 mg total) by mouth 2 (two) times daily. Patient not taking: Reported on 08/15/2016 06/20/16   Garlon Hatchet, PA-C  HYDROcodone-acetaminophen (NORCO/VICODIN) 5-325 MG tablet Take 1-2 tablets by mouth every 6 (six) hours as needed for severe pain. Patient not taking: Reported on 08/21/2016 06/08/16   Gwyneth Sprout, MD  lisinopril (PRINIVIL,ZESTRIL) 5 MG tablet Take 5 mg by mouth daily. 10/27/15   Historical Provider, MD  metroNIDAZOLE (FLAGYL) 500 MG tablet Take 1 tablet (500 mg total) by mouth 2 (two) times daily. One po bid x 7 days Patient not taking: Reported on 08/15/2016 07/03/16   Doug Sou, MD  mycophenolate (CELLCEPT) 500 MG tablet Take 2 tablets by mouth 2 (two) times daily. 03/03/16   Historical Provider, MD  ondansetron (ZOFRAN ODT) 4 MG disintegrating tablet Take 1 tablet (4 mg total) by mouth every 8 (eight) hours as needed for nausea or vomiting. Patient not taking: Reported on 08/21/2016 06/08/16   Gwyneth Sprout, MD  oxyCODONE-acetaminophen (PERCOCET/ROXICET) 5-325 MG tablet Take 1 tablet by mouth every 6 (six) hours as needed for severe pain. 08/24/16   Vivianne Master, PA-C  predniSONE (DELTASONE) 20 MG tablet Take 1 tablet (20 mg total) by mouth daily with breakfast. 08/21/16   Vivianne Master,  PA-C  traMADol (ULTRAM) 50 MG tablet Take 1 tablet (50 mg total) by mouth every 8 (eight) hours as needed. 08/21/16   Vivianne Master, PA-C    Family History No family history on file.  Social History Social History  Substance Use Topics  . Smoking status: Never Smoker  . Smokeless tobacco: Never Used  . Alcohol use No     Allergies   Patient has no known allergies.   Review of Systems Review of Systems  Constitutional: Negative for chills and  fever.  HENT: Negative for congestion.   Respiratory: Negative for shortness of breath.   Gastrointestinal: Positive for abdominal pain, nausea and vomiting.  Genitourinary: Negative for difficulty urinating, vaginal bleeding and vaginal discharge.  Neurological: Positive for headaches. Negative for facial asymmetry and light-headedness.     Physical Exam Updated Vital Signs BP 114/70 (BP Location: Right Arm)   Pulse 82   Temp 99.4 F (37.4 C) (Oral)   Resp 16   Ht  (1.6 m)   Wt 103.9 kg   LMP 08/15/2016 (Exact Date)   SpO2 100%   BMI 40.57 kg/m   Physical Exam  Constitutional: She appears well-developed and well-nourished.  Well appearing  HENT:  Head: Normocephalic and atraumatic.  Nose: Nose normal.  Eyes: Conjunctivae are normal.  Neck: Normal range of motion.  Cardiovascular: Normal rate.   Pulmonary/Chest: Effort normal. No respiratory distress.  Normal work of breathing. No respiratory distress noted.   Abdominal: Soft.  Musculoskeletal: Normal range of motion.  Neurological: She is alert.  Skin: Skin is warm.  Psychiatric: She has a normal mood and affect. Her behavior is normal.  Nursing note and vitals reviewed.    ED Treatments / Results   DIAGNOSTIC STUDIES: Oxygen Saturation is 100% on RA, normal by my interpretation.    COORDINATION OF CARE: 10:25 AM Discussed treatment plan with pt at bedside which includes f/u with PCP which includes labs and pt agreed to plan.   Labs (all labs ordered are listed, but only abnormal results are displayed) Labs Reviewed  POC URINE PREG, ED    EKG  EKG Interpretation None       Radiology No results found.  Procedures Procedures (including critical care time)  Medications Ordered in ED Medications - No data to display   Initial Impression / Assessment and Plan / ED Course  I have reviewed the triage vital signs and the nursing notes.  Pertinent labs & imaging results that were available  during my care of the patient were reviewed by me and considered in my medical decision making (see chart for details).   A pregnancy test today was negative for pregnancy.  Patient was instructed to speak with her PCP or rheumatologist about appropriate birth control methods.  Patient was educated on safe sexual practices.  Patient was offered continued work up for her symptoms but declined, stating she just wanted to know that she was not pregnant.   At this time there does not appear to be any evidence of an acute emergency medical condition and the patient appears stable for discharge with appropriate outpatient follow up.Diagnosis was discussed with patient who verbalizes understanding and is agreeable to discharge.    Final Clinical Impressions(s) / ED Diagnoses   Final diagnoses:  Negative pregnancy test  Lupus erythematosus, unspecified form    New Prescriptions Discharge Medication List as of 09/03/2016 10:30 AM      I personally performed the services described in this documentation,  which was scribed in my presence. The recorded information has been reviewed and is accurate.     Cristina Gong, PA-C 09/03/16 2011    Gerhard Munch, MD 09/07/16 2027

## 2016-09-03 NOTE — ED Notes (Signed)
Papers reviewed with patient and results reviewed by provider with patient. Leaving today alone with daughter. No reports of pain

## 2016-09-03 NOTE — ED Notes (Signed)
Stayed with patients daughter while she went to the restroom to give urine sample.

## 2016-09-07 ENCOUNTER — Other Ambulatory Visit: Payer: Self-pay | Admitting: Family Medicine

## 2016-09-07 ENCOUNTER — Ambulatory Visit (HOSPITAL_COMMUNITY)
Admission: RE | Admit: 2016-09-07 | Discharge: 2016-09-07 | Disposition: A | Discharge status: Home / Self Care | Payer: Medicaid Other | Source: Ambulatory Visit | Attending: Family Medicine | Admitting: Family Medicine

## 2016-09-07 ENCOUNTER — Ambulatory Visit: Payer: Medicaid Other | Attending: Family Medicine | Admitting: Family Medicine

## 2016-09-07 ENCOUNTER — Encounter: Payer: Self-pay | Admitting: Family Medicine

## 2016-09-07 ENCOUNTER — Telehealth: Payer: Self-pay | Admitting: Family Medicine

## 2016-09-07 VITALS — BP 115/75 | HR 80 | Temp 98.8°F | Resp 18 | Ht 63.0 in | Wt 228.6 lb

## 2016-09-07 DIAGNOSIS — R109 Unspecified abdominal pain: Secondary | ICD-10-CM | POA: Insufficient documentation

## 2016-09-07 DIAGNOSIS — Z87442 Personal history of urinary calculi: Secondary | ICD-10-CM

## 2016-09-07 DIAGNOSIS — G8929 Other chronic pain: Secondary | ICD-10-CM

## 2016-09-07 DIAGNOSIS — M545 Low back pain, unspecified: Secondary | ICD-10-CM

## 2016-09-07 DIAGNOSIS — N200 Calculus of kidney: Secondary | ICD-10-CM

## 2016-09-07 DIAGNOSIS — Z9049 Acquired absence of other specified parts of digestive tract: Secondary | ICD-10-CM | POA: Diagnosis not present

## 2016-09-07 DIAGNOSIS — M329 Systemic lupus erythematosus, unspecified: Secondary | ICD-10-CM | POA: Diagnosis not present

## 2016-09-07 LAB — POCT URINALYSIS DIPSTICK
GLUCOSE UA: NEGATIVE
KETONES UA: NEGATIVE
LEUKOCYTES UA: NEGATIVE
Nitrite, UA: NEGATIVE
Protein, UA: >=300
Urobilinogen, UA: 0.2 E.U./dL
pH, UA: 5.5 (ref 5.0–8.0)

## 2016-09-07 LAB — POCT URINE PREGNANCY: PREG TEST UR: NEGATIVE

## 2016-09-07 MED ORDER — ACETAMINOPHEN-CODEINE #3 300-30 MG PO TABS: 1.0000 | ORAL_TABLET | Freq: Three times a day (TID) | ORAL | 0 refills | Status: DC | PRN

## 2016-09-07 MED ORDER — OMEPRAZOLE 20 MG PO CPDR: 20.0000 mg | DELAYED_RELEASE_CAPSULE | Freq: Every day | ORAL | 3 refills | Status: DC

## 2016-09-07 MED ORDER — TAMSULOSIN HCL 0.4 MG PO CAPS: 0.4000 mg | ORAL_CAPSULE | Freq: Every day | ORAL | 0 refills | Status: AC

## 2016-09-07 NOTE — Progress Notes (Signed)
Patient is here for back pain  Patient only taking lupus medication   Patient has not taking it today  Patient has not eaten for today

## 2016-09-07 NOTE — Patient Instructions (Signed)
Back Pain, Adult  Back pain is very common. The pain often gets better over time. The cause of back pain is usually not dangerous. Most people can learn to manage their back pain on their own.  Follow these instructions at home:  Watch your back pain for any changes. The following actions may help to lessen any pain you are feeling:  · Stay active. Start with short walks on flat ground if you can. Try to walk farther each day.  · Exercise regularly as told by your doctor. Exercise helps your back heal faster. It also helps avoid future injury by keeping your muscles strong and flexible.  · Do not sit, drive, or stand in one place for more than 30 minutes.  · Do not stay in bed. Resting more than 1-2 days can slow down your recovery.  · Be careful when you bend or lift an object. Use good form when lifting:  ? Bend at your knees.  ? Keep the object close to your body.  ? Do not twist.  · Sleep on a firm mattress. Lie on your side, and bend your knees. If you lie on your back, put a pillow under your knees.  · Take medicines only as told by your doctor.  · Put ice on the injured area.  ? Put ice in a plastic bag.  ? Place a towel between your skin and the bag.  ? Leave the ice on for 20 minutes, 2-3 times a day for the first 2-3 days. After that, you can switch between ice and heat packs.  · Avoid feeling anxious or stressed. Find good ways to deal with stress, such as exercise.  · Maintain a healthy weight. Extra weight puts stress on your back.    Contact a doctor if:  · You have pain that does not go away with rest or medicine.  · You have worsening pain that goes down into your legs or buttocks.  · You have pain that does not get better in one week.  · You have pain at night.  · You lose weight.  · You have a fever or chills.  Get help right away if:  · You cannot control when you poop (bowel movement) or pee (urinate).  · Your arms or legs feel weak.  · Your arms or legs lose feeling (numbness).  · You feel sick  to your stomach (nauseous) or throw up (vomit).  · You have belly (abdominal) pain.  · You feel like you may pass out (faint).  This information is not intended to replace advice given to you by your health care provider. Make sure you discuss any questions you have with your health care provider.  Document Released: 10/25/2007 Document Revised: 10/14/2015 Document Reviewed: 09/09/2013  Elsevier Interactive Patient Education © 2017 [employer]

## 2016-09-07 NOTE — Progress Notes (Signed)
Subjective:  Patient ID: Kristina Vincent, female    DOB: May 31, 1990  Age: 26 y.o. MRN: [id number]  CC: No chief complaint on file.   HPI Kristina Vincent presents for   Abdominal pain: One-month history. Reports abdominal spasms with nausea. History of gallbladder removal 4 years ago. Denies any vomiting or heartburn. Reports episodes of loose stool. Denies any hematochezia or melena. Denies taking anything for symptoms.  Back pain: Reports two-month history of bilateral back pain. Denies any history of back injury for varices. Reports stabbing like pain to the right side sided back. Reports history of lupus with nephrology consult in the past. She reports being told she had history of proteinuria and nephrolithiasis. She denies taking anything for symptoms.  Outpatient Medications Prior to Visit  Medication Sig Dispense Refill  . acetaminophen (TYLENOL) 500 MG tablet Take 500-1,000 mg by mouth every 6 (six) hours as needed for moderate pain or headache.    Marland Kitchen acetaminophen (TYLENOL) 650 MG CR tablet Take 1,300 mg by mouth every 8 (eight) hours as needed for pain.    . calcium carbonate (OS-CAL - DOSED IN MG OF ELEMENTAL CALCIUM) 1250 (500 Ca) MG tablet Take 1 tablet by mouth daily with breakfast.    . clotrimazole (LOTRIMIN) 1 % cream Apply to affected area 2 times daily (Patient not taking: Reported on 01/24/2016) 15 g 0  . doxycycline (VIBRAMYCIN) 100 MG capsule Take 1 capsule (100 mg total) by mouth 2 (two) times daily. (Patient not taking: Reported on 08/15/2016) 20 capsule 0  . HYDROcodone-acetaminophen (NORCO/VICODIN) 5-325 MG tablet Take 1-2 tablets by mouth every 6 (six) hours as needed for severe pain. (Patient not taking: Reported on 08/21/2016) 10 tablet 0  . metroNIDAZOLE (FLAGYL) 500 MG tablet Take 1 tablet (500 mg total) by mouth 2 (two) times daily. One po bid x 7 days (Patient not taking: Reported on 08/15/2016) 14 tablet 0  . mycophenolate (CELLCEPT) 500 MG tablet Take 2 tablets by  mouth 2 (two) times daily.    . ondansetron (ZOFRAN ODT) 4 MG disintegrating tablet Take 1 tablet (4 mg total) by mouth every 8 (eight) hours as needed for nausea or vomiting. (Patient not taking: Reported on 08/21/2016) 20 tablet 0  . oxyCODONE-acetaminophen (PERCOCET/ROXICET) 5-325 MG tablet Take 1 tablet by mouth every 6 (six) hours as needed for severe pain. 10 tablet 0  . predniSONE (DELTASONE) 20 MG tablet Take 1 tablet (20 mg total) by mouth daily with breakfast. 5 tablet 0  . traMADol (ULTRAM) 50 MG tablet Take 1 tablet (50 mg total) by mouth every 8 (eight) hours as needed. 30 tablet 0  . lisinopril (PRINIVIL,ZESTRIL) 5 MG tablet Take 5 mg by mouth daily.  3   No facility-administered medications prior to visit.     ROS Review of Systems  Constitutional: Negative.   Respiratory: Negative.   Cardiovascular: Negative.   Gastrointestinal: Positive for abdominal pain.  Genitourinary: Positive for flank pain.  Musculoskeletal: Positive for back pain.  Skin: Negative.     Objective:  BP 115/75 (BP Location: Left Arm, Patient Position: Sitting, Cuff Size: Normal)   Pulse 80   Temp 98.8 F (37.1 C) (Oral)   Resp 18   Ht _0  (1.6 m)   Wt 228 lb 9.6 oz (103.7 kg)   LMP 08/15/2016 (Exact Date)   SpO2 99%   BMI 40.49 kg/m   BP/Weight 09/07/2016 08/28/8117 05/26/7827  Systolic BP 562 130 865  Diastolic BP 75 70 81  Wt. (  Lbs) 228.6 229 229.4  BMI 40.49 40.57 40.64     Physical Exam  Constitutional: She appears well-developed and well-nourished.  HENT:  Head: Normocephalic.  Right Ear: External ear normal.  Left Ear: External ear normal.  Nose: Nose normal.  Mouth/Throat: Oropharynx is clear and moist.  Eyes: Conjunctivae are normal. Pupils are equal, round, and reactive to light.  Neck: Normal range of motion. Neck supple.  Cardiovascular: Normal rate, regular rhythm, normal heart sounds and intact distal pulses.   Pulmonary/Chest: Effort normal and breath sounds normal.    Abdominal: Soft. Bowel sounds are normal. There is tenderness.  Musculoskeletal:       Lumbar back: She exhibits pain.  Lymphadenopathy:    She has no cervical adenopathy.  Skin: Skin is warm and dry.  Psychiatric: She has a normal mood and affect.  Nursing note and vitals reviewed.   Assessment & Plan:   Problem List Items Addressed This Visit    None    Visit Diagnoses    Chronic abdominal pain    -  Primary   Relevant Medications   acetaminophen-codeine (TYLENOL #3) 300-30 MG tablet   Other Relevant Orders   DG Abd 2 Views (Completed)   H. pylori breath test (Completed)   CBC with Differential (Completed)   CMP14+EGFR (Completed)   Lipid Panel (Completed)   Lipase (Completed)   Urinalysis Dipstick (Completed)   POCT urine pregnancy (Completed)   History of nephrolithiasis       Relevant Orders   Microalbumin / creatinine urine ratio (Completed)   History of cholecystectomy       Acute bilateral low back pain without sciatica       Relevant Medications   acetaminophen-codeine (TYLENOL #3) 300-30 MG tablet     Meds ordered this encounter  Medications  . omeprazole (PRILOSEC) 20 MG capsule    Sig: Take 1 capsule (20 mg total) by mouth daily.    Dispense:  30 capsule    Refill:  3    Order Specific Question:   Supervising Provider    Answer:   Tresa Garter [id number]  . acetaminophen-codeine (TYLENOL #3) 300-30 MG tablet    Sig: Take 1-2 tablets by mouth every 8 (eight) hours as needed for severe pain.    Dispense:  30 tablet    Refill:  0    Order Specific Question:   Supervising Provider    Answer:   Tresa Garter [id number]    Follow-up: Return if symptoms worsen or fail to improve.   Alfonse Spruce FNP

## 2016-09-07 NOTE — Telephone Encounter (Signed)
Called and notified patient of her imaging results and plan. She is agreeable.

## 2016-09-08 ENCOUNTER — Telehealth: Payer: Self-pay | Admitting: Family Medicine

## 2016-09-08 LAB — MICROALBUMIN / CREATININE URINE RATIO
Creatinine, Urine: 392.6 mg/dL
MICROALB/CREAT RATIO: 844.5 mg/g{creat} — AB (ref 0.0–30.0)
MICROALBUM., U, RANDOM: 3315.7 ug/mL

## 2016-09-08 LAB — CBC WITH DIFFERENTIAL/PLATELET
Basophils Absolute: 0 10*3/uL (ref 0.0–0.2)
Basos: 0 %
EOS (ABSOLUTE): 0.1 10*3/uL (ref 0.0–0.4)
EOS: 2 %
HEMATOCRIT: 39.1 % (ref 34.0–46.6)
HEMOGLOBIN: 13.1 g/dL (ref 11.1–15.9)
IMMATURE GRANS (ABS): 0 10*3/uL (ref 0.0–0.1)
IMMATURE GRANULOCYTES: 0 %
LYMPHS: 30 %
Lymphocytes Absolute: 2.4 10*3/uL (ref 0.7–3.1)
MCH: 28.2 pg (ref 26.6–33.0)
MCHC: 33.5 g/dL (ref 31.5–35.7)
MCV: 84 fL (ref 79–97)
Monocytes Absolute: 0.6 10*3/uL (ref 0.1–0.9)
Monocytes: 7 %
NEUTROS PCT: 61 %
Neutrophils Absolute: 4.8 10*3/uL (ref 1.4–7.0)
Platelets: 304 10*3/uL (ref 150–379)
RBC: 4.65 x10E6/uL (ref 3.77–5.28)
RDW: 14.8 % (ref 12.3–15.4)
WBC: 7.9 10*3/uL (ref 3.4–10.8)

## 2016-09-08 LAB — LIPID PANEL
CHOL/HDL RATIO: 4.4 ratio (ref 0.0–4.4)
CHOLESTEROL TOTAL: 231 mg/dL — AB (ref 100–199)
HDL: 53 mg/dL (ref >39)
LDL Calculated: 156 mg/dL — ABNORMAL HIGH (ref 0–99)
TRIGLYCERIDES: 111 mg/dL (ref 0–149)
VLDL Cholesterol Cal: 22 mg/dL (ref 5–40)

## 2016-09-08 LAB — CMP14+EGFR
ALBUMIN: 4 g/dL (ref 3.5–5.5)
ALT: 18 IU/L (ref 0–32)
AST: 16 IU/L (ref 0–40)
Albumin/Globulin Ratio: 1.3 (ref 1.2–2.2)
Alkaline Phosphatase: 89 IU/L (ref 39–117)
BUN / CREAT RATIO: 12 (ref 9–23)
BUN: 11 mg/dL (ref 6–20)
Bilirubin Total: 0.8 mg/dL (ref 0.0–1.2)
CALCIUM: 9.7 mg/dL (ref 8.7–10.2)
CO2: 20 mmol/L (ref 18–29)
CREATININE: 0.89 mg/dL (ref 0.57–1.00)
Chloride: 104 mmol/L (ref 96–106)
GFR calc Af Amer: 105 mL/min/{1.73_m2} (ref >59)
GFR, EST NON AFRICAN AMERICAN: 91 mL/min/{1.73_m2} (ref >59)
GLOBULIN, TOTAL: 3.1 g/dL (ref 1.5–4.5)
GLUCOSE: 91 mg/dL (ref 65–99)
Potassium: 4.1 mmol/L (ref 3.5–5.2)
SODIUM: 141 mmol/L (ref 134–144)
Total Protein: 7.1 g/dL (ref 6.0–8.5)

## 2016-09-08 LAB — LIPASE: Lipase: 30 U/L (ref 14–72)

## 2016-09-08 NOTE — Telephone Encounter (Signed)
Pt called asking for RN or NP Hairston to call her. States that meds she was prescribed yesterday are not helping ease her pain. Please f/u with pt.

## 2016-09-11 ENCOUNTER — Ambulatory Visit: Payer: Medicaid Other | Admitting: Family Medicine

## 2016-09-11 ENCOUNTER — Telehealth: Payer: Self-pay | Admitting: Family Medicine

## 2016-09-11 DIAGNOSIS — N200 Calculus of kidney: Secondary | ICD-10-CM

## 2016-09-11 DIAGNOSIS — L930 Discoid lupus erythematosus: Secondary | ICD-10-CM

## 2016-09-11 LAB — H. PYLORI BREATH TEST

## 2016-09-11 LAB — H.PYLORI BREATH TEST (REFLEX): H. pylori Breath Test: NEGATIVE

## 2016-09-11 NOTE — Progress Notes (Deleted)
   Subjective:  Patient ID: Kristina Vincent, female    DOB: 21-Jul-1990  Age: 26 y.o. MRN: [id number]  CC: No chief complaint on file.   HPI Kiyo Heal presents for      Outpatient Medications Prior to Visit  Medication Sig Dispense Refill  . acetaminophen (TYLENOL) 500 MG tablet Take 500-1,000 mg by mouth every 6 (six) hours as needed for moderate pain or headache.    Marland Kitchen acetaminophen (TYLENOL) 650 MG CR tablet Take 1,300 mg by mouth every 8 (eight) hours as needed for pain.    Marland Kitchen acetaminophen-codeine (TYLENOL #3) 300-30 MG tablet Take 1-2 tablets by mouth every 8 (eight) hours as needed for severe pain. 30 tablet 0  . calcium carbonate (OS-CAL - DOSED IN MG OF ELEMENTAL CALCIUM) 1250 (500 Ca) MG tablet Take 1 tablet by mouth daily with breakfast.    . clotrimazole (LOTRIMIN) 1 % cream Apply to affected area 2 times daily (Patient not taking: Reported on 01/24/2016) 15 g 0  . doxycycline (VIBRAMYCIN) 100 MG capsule Take 1 capsule (100 mg total) by mouth 2 (two) times daily. (Patient not taking: Reported on 08/15/2016) 20 capsule 0  . HYDROcodone-acetaminophen (NORCO/VICODIN) 5-325 MG tablet Take 1-2 tablets by mouth every 6 (six) hours as needed for severe pain. (Patient not taking: Reported on 08/21/2016) 10 tablet 0  . lisinopril (PRINIVIL,ZESTRIL) 5 MG tablet Take 5 mg by mouth daily.  3  . metroNIDAZOLE (FLAGYL) 500 MG tablet Take 1 tablet (500 mg total) by mouth 2 (two) times daily. One po bid x 7 days (Patient not taking: Reported on 08/15/2016) 14 tablet 0  . mycophenolate (CELLCEPT) 500 MG tablet Take 2 tablets by mouth 2 (two) times daily.    Marland Kitchen omeprazole (PRILOSEC) 20 MG capsule Take 1 capsule (20 mg total) by mouth daily. 30 capsule 3  . ondansetron (ZOFRAN ODT) 4 MG disintegrating tablet Take 1 tablet (4 mg total) by mouth every 8 (eight) hours as needed for nausea or vomiting. (Patient not taking: Reported on 08/21/2016) 20 tablet 0  . oxyCODONE-acetaminophen (PERCOCET/ROXICET) 5-325 MG  tablet Take 1 tablet by mouth every 6 (six) hours as needed for severe pain. 10 tablet 0  . predniSONE (DELTASONE) 20 MG tablet Take 1 tablet (20 mg total) by mouth daily with breakfast. 5 tablet 0  . tamsulosin (FLOMAX) 0.4 MG CAPS capsule Take 1 capsule (0.4 mg total) by mouth daily. For 2 weeks. 14 capsule 0  . traMADol (ULTRAM) 50 MG tablet Take 1 tablet (50 mg total) by mouth every 8 (eight) hours as needed. 30 tablet 0   No facility-administered medications prior to visit.     ROS Review of Systems  Review of Systems - {ros [id number]}    Objective:  LMP 08/15/2016 (Exact Date)   BP/Weight 09/07/2016 09/03/2016 08/21/2016  Systolic BP 115 114 121  Diastolic BP 75 70 81  Wt. (Lbs) 228.6 229 229.4  BMI 40.49 40.57 40.64     Physical Exam   Assessment & Plan:   Problem List Items Addressed This Visit    None      No orders of the defined types were placed in this encounter.   Follow-up: No Follow-up on file.   Lizbeth Bark FNP

## 2016-09-11 NOTE — Telephone Encounter (Signed)
Called patient to follow up

## 2016-09-11 NOTE — Telephone Encounter (Signed)
Pt called asking for RN or NP Hairston to call her. States that meds she was prescribed yesterday are not helping ease her pain. Please f/u with pt. °

## 2016-09-11 NOTE — Telephone Encounter (Signed)
Called patient to follow up with concerns. Was out of office last Friday. Reports initially only taking 1/2 a pill prn then increased to 2 tablets. Patient reports taking only four tablets of Tylenol 3 that was prescribed. She reports " I felt like it wasn't working". She last time she took Tylenol 3 was two days ago. Was scheduled for office appointment today. She reports missing her about due to forgetting that she had one. Offered non-narcotic pain medication to take in addition Tylenol 3, she declined. Reported during phone conversation that she has not heard anything yet regarding her rheumatology referral. Place another request for referral. Will also place referral for urology due to nephrolithiasis.

## 2016-09-12 ENCOUNTER — Other Ambulatory Visit: Payer: Self-pay | Admitting: Family Medicine

## 2016-09-12 DIAGNOSIS — R809 Proteinuria, unspecified: Secondary | ICD-10-CM

## 2016-09-12 MED ORDER — LISINOPRIL 2.5 MG PO TABS: 2.5000 mg | ORAL_TABLET | Freq: Every day | ORAL | 0 refills | Status: DC

## 2016-09-12 NOTE — Telephone Encounter (Signed)
CMA call patient regarding lab results   Patient Verify DOB   Patient was aware and understood  

## 2016-09-12 NOTE — Telephone Encounter (Signed)
-----   Message from Kristina Bark, FNP sent at 09/12/2016  5:56 AM EDT ----- Microalbumin/creatinine ratio level was elevated. This tests for protein in your urine that could indicate early signs of kidney damage. You have been prescribed lisinopril to help lower risk. Recommend recheck in 3 months. Lipid levels were elevated. This can increase your risk of heart disease overtime. Start eating a diet low in saturated fat. Limit your intake of fried foods, red meats, and whole milk. Begin exercising at least 3-5 times per week for 30 minutes. H.pylori is negative. H.pylori is a bacteria that can infect the stomach and cause stomach ulcers. Labs normal. Liver function normal Kidney function normal Lipase normal. Lipase is elevated with pancreatitis.

## 2016-09-21 ENCOUNTER — Other Ambulatory Visit: Payer: Medicaid Other | Admitting: Family Medicine

## 2016-09-22 ENCOUNTER — Encounter (HOSPITAL_COMMUNITY): Payer: Self-pay

## 2016-09-22 ENCOUNTER — Ambulatory Visit (HOSPITAL_COMMUNITY)
Admission: EM | Admit: 2016-09-22 | Discharge: 2016-09-22 | Disposition: A | Discharge status: Home / Self Care | Payer: Medicaid Other | Attending: Family Medicine | Admitting: Family Medicine

## 2016-09-22 DIAGNOSIS — R0789 Other chest pain: Secondary | ICD-10-CM | POA: Diagnosis not present

## 2016-09-22 MED ORDER — NAPROXEN 500 MG PO TABS: 500.0000 mg | ORAL_TABLET | Freq: Two times a day (BID) | ORAL | 0 refills | Status: DC

## 2016-09-22 NOTE — ED Triage Notes (Signed)
Pt having chest pain for 3 weeks located in the middle of her chest radiating to her back. It comes and goes and said she feels as though she is going to pass out when it happens. Taking tylenol without relief.

## 2016-09-22 NOTE — ED Provider Notes (Signed)
CSN: [id number]     Arrival date & time 09/22/16  1410 History   None    Chief Complaint  Patient presents with  . Chest Pain   (Consider location/radiation/quality/duration/timing/severity/associated sxs/prior Treatment) Patient c/o having chest pain for 3 weeks and states she would like to have it checked out   The history is provided by the patient.  Chest Pain  Pain location:  Substernal area Pain quality: aching   Pain radiates to:  Upper back Pain severity:  Mild Onset quality:  Sudden Duration:  3 weeks Timing:  Constant Chronicity:  New Relieved by:  Nothing   Past Medical History:  Diagnosis Date  . Lupus    Past Surgical History:  Procedure Laterality Date  . CHOLECYSTECTOMY     No family history on file. Social History  Substance Use Topics  . Smoking status: Never Smoker  . Smokeless tobacco: Never Used  . Alcohol use No   OB History    Gravida Para Term Preterm AB Living   1             SAB TAB Ectopic Multiple Live Births                 Review of Systems  Constitutional: Negative.   HENT: Negative.   Eyes: Negative.   Cardiovascular: Positive for chest pain.  Gastrointestinal: Negative.   Endocrine: Negative.   Genitourinary: Negative.   Musculoskeletal: Negative.   Allergic/Immunologic: Negative.   Neurological: Negative.   Hematological: Negative.   Psychiatric/Behavioral: Negative.     Allergies  Patient has no known allergies.  Home Medications   Prior to Admission medications   Medication Sig Start Date End Date Taking? Authorizing Provider  lisinopril (PRINIVIL,ZESTRIL) 2.5 MG tablet Take 1 tablet (2.5 mg total) by mouth daily. 09/12/16  Yes Lizbeth Bark, FNP  mycophenolate (CELLCEPT) 500 MG tablet Take 2 tablets by mouth 2 (two) times daily. 03/03/16  Yes Historical Provider, MD  acetaminophen (TYLENOL) 500 MG tablet Take 500-1,000 mg by mouth every 6 (six) hours as needed for moderate pain or headache.    Historical  Provider, MD  acetaminophen (TYLENOL) 650 MG CR tablet Take 1,300 mg by mouth every 8 (eight) hours as needed for pain.    Historical Provider, MD  acetaminophen-codeine (TYLENOL #3) 300-30 MG tablet Take 1-2 tablets by mouth every 8 (eight) hours as needed for severe pain. 09/07/16   Lizbeth Bark, FNP  calcium carbonate (OS-CAL - DOSED IN MG OF ELEMENTAL CALCIUM) 1250 (500 Ca) MG tablet Take 1 tablet by mouth daily with breakfast.    Historical Provider, MD  clotrimazole (LOTRIMIN) 1 % cream Apply to affected area 2 times daily Patient not taking: Reported on 01/24/2016 12/14/15   Azalia Bilis, MD  doxycycline (VIBRAMYCIN) 100 MG capsule Take 1 capsule (100 mg total) by mouth 2 (two) times daily. Patient not taking: Reported on 08/15/2016 06/20/16   Garlon Hatchet, PA-C  HYDROcodone-acetaminophen (NORCO/VICODIN) 5-325 MG tablet Take 1-2 tablets by mouth every 6 (six) hours as needed for severe pain. Patient not taking: Reported on 08/21/2016 06/08/16   Gwyneth Sprout, MD  metroNIDAZOLE (FLAGYL) 500 MG tablet Take 1 tablet (500 mg total) by mouth 2 (two) times daily. One po bid x 7 days Patient not taking: Reported on 08/15/2016 07/03/16   Doug Sou, MD  naproxen (NAPROSYN) 500 MG tablet Take 1 tablet (500 mg total) by mouth 2 (two) times daily with a meal. 09/22/16   Anselm Pancoast  Ajahni Nay, FNP  omeprazole (PRILOSEC) 20 MG capsule Take 1 capsule (20 mg total) by mouth daily. 09/07/16   Lizbeth BarkMandesia R Hairston, FNP  ondansetron (ZOFRAN ODT) 4 MG disintegrating tablet Take 1 tablet (4 mg total) by mouth every 8 (eight) hours as needed for nausea or vomiting. Patient not taking: Reported on 08/21/2016 06/08/16   Gwyneth SproutWhitney Plunkett, MD  oxyCODONE-acetaminophen (PERCOCET/ROXICET) 5-325 MG tablet Take 1 tablet by mouth every 6 (six) hours as needed for severe pain. 08/24/16   Vivianne Masteriffany S Noel, PA-C  predniSONE (DELTASONE) 20 MG tablet Take 1 tablet (20 mg total) by mouth daily with breakfast. 08/21/16   Vivianne Masteriffany S Noel, PA-C   traMADol (ULTRAM) 50 MG tablet Take 1 tablet (50 mg total) by mouth every 8 (eight) hours as needed. 08/21/16   Vivianne Masteriffany S Noel, PA-C   Meds Ordered and Administered this Visit  Medications - No data to display  BP (!) 141/79 (BP Location: Left Arm)   Pulse 86   Temp 98.6 F (37 C) (Oral)   Resp 16   LMP 08/22/2016 (Within Days)   SpO2 100%   Breastfeeding? No  No data found.   Physical Exam  Constitutional: She appears well-developed and well-nourished.  HENT:  Head: Normocephalic and atraumatic.  Eyes: Conjunctivae and EOM are normal. Pupils are equal, round, and reactive to light.  Neck: Normal range of motion. Neck supple.  Cardiovascular: Normal rate, regular rhythm and normal heart sounds.   Pulmonary/Chest: Effort normal and breath sounds normal.  Musculoskeletal: She exhibits tenderness.  TTP mid chest over sternum  Nursing note and vitals reviewed.   Urgent Care Course     Procedures (including critical care time)  Labs Review Labs Reviewed - No data to display  Imaging Review No results found.   Visual Acuity Review  Right Eye Distance:   Left Eye Distance:   Bilateral Distance:    Right Eye Near:   Left Eye Near:    Bilateral Near:         MDM   1. Chest wall pain    EKG wnl  Naprosyn 500mg  one po bid x 7 days #14     Deatra CanterWilliam J Jeri Jeanbaptiste, FNP 09/22/16 1551

## 2016-10-02 ENCOUNTER — Ambulatory Visit (HOSPITAL_COMMUNITY)
Admission: EM | Admit: 2016-10-02 | Discharge: 2016-10-02 | Disposition: A | Discharge status: Home / Self Care | Payer: Medicaid Other | Attending: Internal Medicine | Admitting: Internal Medicine

## 2016-10-02 ENCOUNTER — Encounter (HOSPITAL_COMMUNITY): Payer: Self-pay | Admitting: Emergency Medicine

## 2016-10-02 DIAGNOSIS — M329 Systemic lupus erythematosus, unspecified: Secondary | ICD-10-CM | POA: Diagnosis not present

## 2016-10-02 DIAGNOSIS — L0291 Cutaneous abscess, unspecified: Secondary | ICD-10-CM | POA: Diagnosis not present

## 2016-10-02 DIAGNOSIS — Z113 Encounter for screening for infections with a predominantly sexual mode of transmission: Secondary | ICD-10-CM | POA: Diagnosis not present

## 2016-10-02 DIAGNOSIS — N898 Other specified noninflammatory disorders of vagina: Secondary | ICD-10-CM | POA: Diagnosis present

## 2016-10-02 DIAGNOSIS — Z3202 Encounter for pregnancy test, result negative: Secondary | ICD-10-CM | POA: Diagnosis not present

## 2016-10-02 DIAGNOSIS — Z9049 Acquired absence of other specified parts of digestive tract: Secondary | ICD-10-CM | POA: Diagnosis not present

## 2016-10-02 DIAGNOSIS — Z202 Contact with and (suspected) exposure to infections with a predominantly sexual mode of transmission: Secondary | ICD-10-CM

## 2016-10-02 DIAGNOSIS — L0231 Cutaneous abscess of buttock: Secondary | ICD-10-CM | POA: Insufficient documentation

## 2016-10-02 LAB — POCT PREGNANCY, URINE: Preg Test, Ur: NEGATIVE

## 2016-10-02 MED ORDER — AZITHROMYCIN 250 MG PO TABS
1000.0000 mg | ORAL_TABLET | Freq: Once | ORAL | Status: AC
  Administered 2016-10-02: 1000 mg via ORAL

## 2016-10-02 MED ORDER — CEFTRIAXONE SODIUM 250 MG IJ SOLR
INTRAMUSCULAR | Status: AC
  Filled 2016-10-02: qty 250

## 2016-10-02 MED ORDER — AZITHROMYCIN 250 MG PO TABS
ORAL_TABLET | ORAL | Status: AC
  Filled 2016-10-02: qty 4

## 2016-10-02 MED ORDER — METRONIDAZOLE 500 MG PO TABS: 500.0000 mg | ORAL_TABLET | Freq: Two times a day (BID) | ORAL | 0 refills | Status: DC

## 2016-10-02 MED ORDER — CEFTRIAXONE SODIUM 1 G IJ SOLR
1.0000 g | Freq: Once | INTRAMUSCULAR | Status: AC
  Administered 2016-10-02: 1 g via INTRAMUSCULAR

## 2016-10-02 MED ORDER — CEFTRIAXONE SODIUM 1 G IJ SOLR
INTRAMUSCULAR | Status: AC
  Filled 2016-10-02: qty 10

## 2016-10-02 MED ORDER — LIDOCAINE HCL (PF) 2 % IJ SOLN
INTRAMUSCULAR | Status: AC
  Filled 2016-10-02: qty 2

## 2016-10-02 MED ORDER — LIDOCAINE HCL (PF) 1 % IJ SOLN
INTRAMUSCULAR | Status: AC
  Filled 2016-10-02: qty 2

## 2016-10-02 MED ORDER — DOXYCYCLINE HYCLATE 100 MG PO CAPS: 100.0000 mg | ORAL_CAPSULE | Freq: Two times a day (BID) | ORAL | 0 refills | Status: AC

## 2016-10-02 NOTE — ED Triage Notes (Signed)
[facility] a [facility] abscess on her buttocks. The patient reported a vaginal discharge. The patient also requested STD testing.

## 2016-10-02 NOTE — Discharge Instructions (Signed)
Your abscess has been drained, and samples have been taking to send for the lab for culture and sensitivity.  With regard to vaginal discharge, you are being tested for gonorrhea, chlamydia, Trichomonas, BV, yeast. If anything is positive you'll be notified in 3-5 business days.  You've received antibiotic treatment here in the clinic, you've been given ceftriaxone, azithromycin, and I'm prescribing metronidazole, take one tablet twice a day for 7 days, do not drink any alcohol while taking this as it'll make you very ill. I also prescribed doxycycline, take one tablet twice a day.  If your abscess returns, or if your vaginal discharge fails to resolve, return to clinic as needed.

## 2016-10-02 NOTE — ED Provider Notes (Signed)
CSN: [id number]     Arrival date & time 10/02/16  1707 History   First MD Initiated Contact with Patient 10/02/16 1850     Chief Complaint  Patient presents with  . Abscess  . Vaginal Discharge   (Consider location/radiation/quality/duration/timing/severity/associated sxs/prior Treatment) 26 year old female presents to clinic with 2 chief complaints tonight the first is a abscess on her buttocks. Been present for 1 week, worsening, has experienced pain, pressure, redness in the area. She has no fever, chills, or any systemic symptoms.  Second complaint is vaginal discharge, ongoing for 2 days, states it is white, thick, particularly malodorous. She is also expressing a desire for STD treatment, states she is sexually active, with 1 partner, does not use protection. She has no pain with intercourse, no pelvic pain, no abdominal pain, flank pain, or other symptoms    Abscess  Vaginal Discharge  Associated symptoms: no dysuria     Past Medical History:  Diagnosis Date  . Lupus    Past Surgical History:  Procedure Laterality Date  . CHOLECYSTECTOMY     History reviewed. No pertinent family history. Social History  Substance Use Topics  . Smoking status: Never Smoker  . Smokeless tobacco: Never Used  . Alcohol use No   OB History    Gravida Para Term Preterm AB Living   1             SAB TAB Ectopic Multiple Live Births                 Review of Systems  Constitutional: Negative.   HENT: Negative.   Respiratory: Negative.   Cardiovascular: Negative.   Gastrointestinal: Negative.   Genitourinary: Positive for vaginal discharge. Negative for dysuria, flank pain, frequency, genital sores and vaginal pain.  Musculoskeletal: Negative.   Skin:       Abscess  Neurological: Negative.     Allergies  Patient has no known allergies.  Home Medications   Prior to Admission medications   Medication Sig Start Date End Date Taking? Authorizing Provider  lisinopril  (PRINIVIL,ZESTRIL) 2.5 MG tablet Take 1 tablet (2.5 mg total) by mouth daily. 09/12/16  Yes Hairston, Oren BeckmannMandesia R, FNP  mycophenolate (CELLCEPT) 500 MG tablet Take 2 tablets by mouth 2 (two) times daily. 03/03/16  Yes [provider]  doxycycline (VIBRAMYCIN) 100 MG capsule Take 1 capsule (100 mg total) by mouth 2 (two) times daily. 10/02/16   Dorena BodoKennard, Antawn Sison, NP  metroNIDAZOLE (FLAGYL) 500 MG tablet Take 1 tablet (500 mg total) by mouth 2 (two) times daily. 10/02/16   Dorena BodoKennard, Zerrick Hanssen, NP   Meds Ordered and Administered this Visit   Medications  cefTRIAXone (ROCEPHIN) injection 1 g (1 g Intramuscular Given 10/02/16 1923)  azithromycin (ZITHROMAX) tablet 1,000 mg (1,000 mg Oral Given 10/02/16 1918)    BP 132/77 (BP Location: Right Arm)   Pulse (!) 105   Temp 98.9 F (37.2 C) (Oral)   Resp 18   SpO2 98%  No data found.   Physical Exam  Constitutional: She is oriented to person, place, and time. She appears well-developed and well-nourished. No distress.  HENT:  Head: Normocephalic.  Right Ear: External ear normal.  Left Ear: External ear normal.  Eyes: Conjunctivae are normal.  Neck: Normal range of motion.  Cardiovascular: Normal rate and regular rhythm.   Pulmonary/Chest: Effort normal and breath sounds normal.  Abdominal: Soft. Bowel sounds are normal. She exhibits no distension. There is no tenderness.  Genitourinary: Uterus normal. Pelvic exam was performed  with patient supine. There is no tenderness on the right labia. There is no tenderness on the left labia. Cervix exhibits discharge. Cervix exhibits no motion tenderness and no friability. Right adnexum displays no tenderness. Left adnexum displays no tenderness. Vaginal discharge found.  Lymphadenopathy:       Right: No inguinal adenopathy present.       Left: No inguinal adenopathy present.  Neurological: She is alert and oriented to person, place, and time.  Skin: Skin is warm. Capillary refill takes less than 2  seconds. She is not diaphoretic.     Psychiatric: She has a normal mood and affect. Her behavior is normal.  Nursing note and vitals reviewed.   Urgent Care Course     Procedures (including critical care time)  Labs Review Labs Reviewed  AEROBIC/ANAEROBIC CULTURE (SURGICAL/DEEP WOUND)  POCT PREGNANCY, URINE  CERVICOVAGINAL ANCILLARY ONLY    Imaging Review No results found.     MDM   1. Abscess   2. Vaginal discharge   3. Screening examination for STD (sexually transmitted disease)    Pregnancy test negative, I&D performed, cultures obtained. Given 1 g ceftriaxone in clinic, discharged with prescription for doxycycline. Wound care guidelines provided.  Vaginal discharge, given azithromycin in clinic, also given ceftriaxone as well to cover for skin infection, and prescribed metronidazole. Counseling provided on safe sex practices, return to clinic in one week as needed.     Dorena Bodo, NP 10/02/16 1956

## 2016-10-03 LAB — CERVICOVAGINAL ANCILLARY ONLY
BACTERIAL VAGINITIS: POSITIVE — AB
CHLAMYDIA, DNA PROBE: NEGATIVE
Candida vaginitis: NEGATIVE
NEISSERIA GONORRHEA: POSITIVE — AB
TRICH (WINDOWPATH): NEGATIVE

## 2016-10-07 LAB — AEROBIC/ANAEROBIC CULTURE (SURGICAL/DEEP WOUND)

## 2016-10-07 LAB — AEROBIC/ANAEROBIC CULTURE W GRAM STAIN (SURGICAL/DEEP WOUND)

## 2016-10-10 ENCOUNTER — Other Ambulatory Visit: Payer: Medicaid Other | Admitting: Family Medicine

## 2016-10-18 ENCOUNTER — Inpatient Hospital Stay (HOSPITAL_COMMUNITY)
Admission: AD | Admit: 2016-10-18 | Discharge: 2016-10-18 | Disposition: A | Discharge status: Home / Self Care | Payer: Medicaid Other | Source: Ambulatory Visit | Attending: Obstetrics and Gynecology | Admitting: Obstetrics and Gynecology

## 2016-10-18 ENCOUNTER — Inpatient Hospital Stay (HOSPITAL_COMMUNITY): Payer: Medicaid Other

## 2016-10-18 ENCOUNTER — Encounter (HOSPITAL_COMMUNITY): Payer: Self-pay

## 2016-10-18 DIAGNOSIS — R109 Unspecified abdominal pain: Secondary | ICD-10-CM | POA: Diagnosis present

## 2016-10-18 DIAGNOSIS — B373 Candidiasis of vulva and vagina: Secondary | ICD-10-CM

## 2016-10-18 DIAGNOSIS — B9689 Other specified bacterial agents as the cause of diseases classified elsewhere: Secondary | ICD-10-CM | POA: Diagnosis not present

## 2016-10-18 DIAGNOSIS — Z79899 Other long term (current) drug therapy: Secondary | ICD-10-CM | POA: Insufficient documentation

## 2016-10-18 DIAGNOSIS — O26899 Other specified pregnancy related conditions, unspecified trimester: Secondary | ICD-10-CM

## 2016-10-18 DIAGNOSIS — O9989 Other specified diseases and conditions complicating pregnancy, childbirth and the puerperium: Secondary | ICD-10-CM | POA: Insufficient documentation

## 2016-10-18 DIAGNOSIS — Z3A01 Less than 8 weeks gestation of pregnancy: Secondary | ICD-10-CM | POA: Insufficient documentation

## 2016-10-18 DIAGNOSIS — O26892 Other specified pregnancy related conditions, second trimester: Secondary | ICD-10-CM | POA: Insufficient documentation

## 2016-10-18 DIAGNOSIS — M329 Systemic lupus erythematosus, unspecified: Secondary | ICD-10-CM | POA: Insufficient documentation

## 2016-10-18 DIAGNOSIS — O98812 Other maternal infectious and parasitic diseases complicating pregnancy, second trimester: Secondary | ICD-10-CM | POA: Diagnosis not present

## 2016-10-18 DIAGNOSIS — N760 Acute vaginitis: Secondary | ICD-10-CM

## 2016-10-18 DIAGNOSIS — B3731 Acute candidiasis of vulva and vagina: Secondary | ICD-10-CM | POA: Diagnosis present

## 2016-10-18 HISTORY — DX: Other specified bacterial agents as the cause of diseases classified elsewhere: B96.89

## 2016-10-18 HISTORY — DX: Other specified bacterial agents as the cause of diseases classified elsewhere: N76.0

## 2016-10-18 HISTORY — DX: Candidiasis of vulva and vagina: B37.3

## 2016-10-18 HISTORY — DX: Acute candidiasis of vulva and vagina: B37.31

## 2016-10-18 HISTORY — DX: Acute vaginitis: N76.0

## 2016-10-18 LAB — URINALYSIS, ROUTINE W REFLEX MICROSCOPIC
BILIRUBIN URINE: NEGATIVE
Glucose, UA: NEGATIVE mg/dL
HGB URINE DIPSTICK: NEGATIVE
Ketones, ur: NEGATIVE mg/dL
LEUKOCYTES UA: NEGATIVE
NITRITE: NEGATIVE
Protein, ur: >=300 mg/dL — AB
SPECIFIC GRAVITY, URINE: 1.027 (ref 1.005–1.030)
pH: 5 (ref 5.0–8.0)

## 2016-10-18 LAB — WET PREP, GENITAL
SPERM: NONE SEEN
Trich, Wet Prep: NONE SEEN

## 2016-10-18 LAB — POCT PREGNANCY, URINE: PREG TEST UR: POSITIVE — AB

## 2016-10-18 LAB — CBC
HEMATOCRIT: 37.3 % (ref 36.0–46.0)
Hemoglobin: 12.9 g/dL (ref 12.0–15.0)
MCH: 29.1 pg (ref 26.0–34.0)
MCHC: 34.6 g/dL (ref 30.0–36.0)
MCV: 84.2 fL (ref 78.0–100.0)
Platelets: 252 10*3/uL (ref 150–400)
RBC: 4.43 MIL/uL (ref 3.87–5.11)
RDW: 13.1 % (ref 11.5–15.5)
WBC: 12.4 10*3/uL — ABNORMAL HIGH (ref 4.0–10.5)

## 2016-10-18 LAB — HCG, QUANTITATIVE, PREGNANCY: hCG, Beta Chain, Quant, S: 19989 m[IU]/mL — ABNORMAL HIGH (ref <5)

## 2016-10-18 MED ORDER — TERCONAZOLE 0.4 % VA CREA: 1.0000 | TOPICAL_CREAM | Freq: Every day | VAGINAL | 0 refills | Status: AC

## 2016-10-18 NOTE — MAU Note (Signed)
Pt states she had a +upt 2 days ago, began having lower abdominal cramping that started yesterday-rates 5/10. States she took Tylenol around 10am, states it did help. Pt denies vag bleeding or discharge. LMP: ? March

## 2016-10-18 NOTE — MAU Provider Note (Signed)
History     CSN: [id number]  Arrival date and time: 10/18/16 7829   First Provider Initiated Contact with Patient 10/18/16 2030      Chief Complaint  Patient presents with  . Possible Pregnancy  . Abdominal Pain   [name] is a 26 yo G8P1061 at 16.[redacted] wks gestation by very unsure LMP presenting with complaints of intermittent abdominal pain since yesterday. She took Tylenol about 1030 this morning that resolved the abdominal pain.  She reports that she does not know when her LMP, but she usually comes on about 2 days before her best friend.  She states her best friend last remembers her having a period around 06/27/2016. She did not have a period in March, April or May.  She denies VB or abnormal d/c.  She reports being dx'd and tx'd for Gonorrhea 2 wks ago.  She did not have a TOC and not sure if her partner was tx'd, but she him.  She states that she is no longer involved him sexually or otherwise. She has h/o a previous SVD 4 yrs ago in [place], [place]. She does not have a local GYN Provider at this time.  Abdominal Pain  This is a new problem. The current episode started yesterday. The onset quality is sudden. The problem occurs intermittently. The problem has been gradually improving. The pain is located in the suprapubic region. The pain is at a severity of 0/10. The patient is experiencing no pain. The quality of the pain is cramping. The abdominal pain does not radiate. Associated symptoms include nausea. Nothing aggravates the pain. The pain is relieved by certain positions. She has tried acetaminophen for the symptoms. The treatment provided no relief.    Past Medical History:  Diagnosis Date  . Lupus     Past Surgical History:  Procedure Laterality Date  . CHOLECYSTECTOMY      History reviewed. No pertinent family history.  Social History  Substance Use Topics  . Smoking status: Never Smoker  . Smokeless tobacco: Never Used  . Alcohol use No    Allergies: No  Known Allergies  Prescriptions Prior to Admission  Medication Sig Dispense Refill Last Dose  . doxycycline (VIBRAMYCIN) 100 MG capsule Take 1 capsule (100 mg total) by mouth 2 (two) times daily. 20 capsule 0   . lisinopril (PRINIVIL,ZESTRIL) 2.5 MG tablet Take 1 tablet (2.5 mg total) by mouth daily. 90 tablet 0 10/02/2016 at Unknown time  . metroNIDAZOLE (FLAGYL) 500 MG tablet Take 1 tablet (500 mg total) by mouth 2 (two) times daily. 14 tablet 0   . mycophenolate (CELLCEPT) 500 MG tablet Take 2 tablets by mouth 2 (two) times daily.   10/02/2016 at Unknown time    Review of Systems  Constitutional: Negative.   HENT: Negative.   Eyes: Negative.   Respiratory: Negative.   Cardiovascular: Negative.   Gastrointestinal: Positive for abdominal pain (lower abd cramping earlier) and nausea.  Endocrine: Negative.   Musculoskeletal: Negative.   Skin: Negative.   Allergic/Immunologic: Negative.   Neurological: Positive for dizziness and light-headedness.  Hematological: Negative.   Psychiatric/Behavioral: Negative.    Physical Exam   Blood pressure 135/78, pulse (!) 108, temperature 99.5 F (37.5 C), temperature source Oral, resp. rate 20, height 5\' 4"  (1.626 m), weight 103.4 kg (228 lb), last menstrual period 08/02/2016, SpO2 99 %.  Physical Exam  Constitutional: She is oriented to person, place, and time. She appears well-developed and well-nourished.  HENT:  Head: Normocephalic.  Eyes:  Pupils are equal, round, and reactive to light.  Neck: Normal range of motion. Neck supple.  Cardiovascular: Normal rate, regular rhythm, normal heart sounds and intact distal pulses.   Respiratory: Effort normal and breath sounds normal.  GI: Soft. Bowel sounds are normal.  Genitourinary:  Genitourinary Comments: Uterus: slightly enlarged, S<D, cx; smooth, pink, no lesions, scant amt of, white d/c, closed/long/firm, no CMT or friability   Musculoskeletal: Normal range of motion.  Neurological: She is  alert and oriented to person, place, and time. She has normal reflexes.  Skin: Skin is warm and dry.  Psychiatric: She has a normal mood and affect. Her behavior is normal. Judgment and thought content normal.   Results for orders placed or performed during the hospital encounter of 10/18/16 (from the past 24 hour(s))  Urinalysis, Routine w reflex microscopic     Status: Abnormal   Collection Time: 10/18/16  7:26 PM  Result Value Ref Range   Color, Urine YELLOW YELLOW   APPearance HAZY (A) CLEAR   Specific Gravity, Urine 1.027 1.005 - 1.030   pH 5.0 5.0 - 8.0   Glucose, UA NEGATIVE NEGATIVE mg/dL   Hgb urine dipstick NEGATIVE NEGATIVE   Bilirubin Urine NEGATIVE NEGATIVE   Ketones, ur NEGATIVE NEGATIVE mg/dL   Protein, ur >=161 (A) NEGATIVE mg/dL   Nitrite NEGATIVE NEGATIVE   Leukocytes, UA NEGATIVE NEGATIVE   RBC / HPF 0-5 0 - 5 RBC/hpf   WBC, UA 0-5 0 - 5 WBC/hpf   Bacteria, UA RARE (A) NONE SEEN   Squamous Epithelial / LPF 0-5 (A) NONE SEEN   Mucous PRESENT   Pregnancy, urine POC     Status: Abnormal   Collection Time: 10/18/16  7:44 PM  Result Value Ref Range   Preg Test, Ur POSITIVE (A) NEGATIVE  Wet prep, genital     Status: Abnormal   Collection Time: 10/18/16  8:32 PM  Result Value Ref Range   Yeast Wet Prep HPF POC PRESENT (A) NONE SEEN   Trich, Wet Prep NONE SEEN NONE SEEN   Clue Cells Wet Prep HPF POC PRESENT (A) NONE SEEN   WBC, Wet Prep HPF POC MANY (A) NONE SEEN   Sperm NONE SEEN   CBC     Status: Abnormal   Collection Time: 10/18/16  8:40 PM  Result Value Ref Range   WBC 12.4 (H) 4.0 - 10.5 K/uL   RBC 4.43 3.87 - 5.11 MIL/uL   Hemoglobin 12.9 12.0 - 15.0 g/dL   HCT 09.6 04.5 - 40.9 %   MCV 84.2 78.0 - 100.0 fL   MCH 29.1 26.0 - 34.0 pg   MCHC 34.6 30.0 - 36.0 g/dL   RDW 81.1 91.4 - 78.2 %   Platelets 252 150 - 400 K/uL  hCG, quantitative, pregnancy     Status: Abnormal   Collection Time: 10/18/16  8:40 PM  Result Value Ref Range   hCG, Beta Chain,  Quant, S 19,989 (H) <5 mIU/mL  ABO/Rh     Status: None (Preliminary result)   Collection Time: 10/18/16  8:40 PM  Result Value Ref Range   ABO/RH(D) O POS    US Ob Comp Less 14 Wks  Result Date: 10/18/2016 CLINICAL DATA:  Abdominal pain and cramping since yesterday. EXAM: OBSTETRIC <14 WK [place] AND TRANSVAGINAL OB US TECHNIQUE: Both transabdominal and transvaginal ultrasound examinations were performed for complete evaluation of the gestation as well as the maternal uterus, adnexal regions, and pelvic cul-de-sac. Transvaginal technique was performed to  assess early pregnancy. COMPARISON:  None. FINDINGS: Intrauterine gestational sac: Single Yolk sac:  Visualized. Embryo:  Visualized. Cardiac Activity: Not Visualized. Heart Rate: Not applicable CRL:  2  mm   5 w   5 d                  US EDC: 06/15/2017 Subchorionic hemorrhage:  Small periimplantation bleed. Maternal uterus/adnexae: Corpus luteum on the left. Normal right ovary. IMPRESSION: The gestational sac, yolk sac and fetal pole are identified low without demonstrable gestational cardiac activity at this time. This may be due to the stage gestation at 5 weeks 5 days. Follow-up is therefore recommended in 1 week to establish viability. Small perigestational/ periimplantation bleed can be followed up as well. Electronically Signed   By: Tollie Ethavid  Kwon M.D.   On: 10/18/2016 22:29   [place] Ob Transvaginal  Result Date: 10/18/2016 CLINICAL DATA:  Abdominal pain and cramping since yesterday. EXAM: OBSTETRIC <14 WK US AND TRANSVAGINAL OB US TECHNIQUE: Both transabdominal and transvaginal ultrasound examinations were performed for complete evaluation of the gestation as well as the maternal uterus, adnexal regions, and pelvic cul-de-sac. Transvaginal technique was performed to assess early pregnancy. COMPARISON:  None. FINDINGS: Intrauterine gestational sac: Single Yolk sac:  Visualized. Embryo:  Visualized. Cardiac Activity: Not Visualized. Heart Rate: Not  applicable CRL:  2  mm   5 w   5 d                  US EDC: 06/15/2017 Subchorionic hemorrhage:  Small periimplantation bleed. Maternal uterus/adnexae: Corpus luteum on the left. Normal right ovary. IMPRESSION: The gestational sac, yolk sac and fetal pole are identified low without demonstrable gestational cardiac activity at this time. This may be due to the stage gestation at 5 weeks 5 days. Follow-up is therefore recommended in 1 week to establish viability. Small perigestational/ periimplantation bleed can be followed up as well. Electronically Signed   By: Tollie Ethavid  Kwon M.D.   On: 10/18/2016 22:29    MAU Course  Procedures  MDM CCUA UPT CBC CMP OB U/S <14 wks Ob U/S Transvaginal HCG ABO/Rh Wet Prep GC/CT HIV  Assessment and Plan  Report given and care assumed by Thressa ShellerHeather Hogan, CNM @ [phone] 1. Candida vaginitis   2. Abdominal pain affecting pregnancy   3. Bacterial vaginitis    DC home Comfort measures reviewed  1st Trimester precautions  Bleeding precautions RX: terazol 7 as directed  Return to MAU as needed FU with OB as planned  Follow-up Information    [facility]. Schedule an appointment as soon as possible for a visit.   Specialty:  Obstetrics and Gynecology Why:  To start prenatal care Contact information: [address], [address] [place] [phone] [phone]       [facility] Call.   Specialty:  Obstetrics and Gynecology Why:  option #2 to call to start prenatal care Contact information: [address] [place] [phone]       [facility]. Call.   Specialty:  Obstetrics and Gynecology Why:  option #3 to start prenatal care Contact information: [address] [place] [phone]          Raelyn MoraRolitta Dawson MSN, CNM 10/18/2016, 8:32 PM

## 2016-10-18 NOTE — Discharge Instructions (Signed)
First Trimester of Pregnancy The first trimester of pregnancy is from week 1 until the end of week 13 (months 1 through 3). A week after a sperm fertilizes an egg, the egg will implant on the wall of the uterus. This embryo will begin to develop into a baby. Genes from you and your partner will form the baby. The female genes will determine whether the baby will be a boy or a girl. At 6-8 weeks, the eyes and face will be formed, and the heartbeat can be seen on ultrasound. At the end of 12 weeks, all the baby's organs will be formed. Now that you are pregnant, you will want to do everything you can to have a healthy baby. Two of the most important things are to get good prenatal care and to follow your health care provider's instructions. Prenatal care is all the medical care you receive before the baby's birth. This care will help prevent, find, and treat any problems during the pregnancy and childbirth. Body changes during your first trimester Your body goes through many changes during pregnancy. The changes vary from woman to woman.  You may gain or lose a couple of pounds at first.  You may feel sick to your stomach (nauseous) and you may throw up (vomit). If the vomiting is uncontrollable, call your health care provider.  You may tire easily.  You may develop headaches that can be relieved by medicines. All medicines should be approved by your health care provider.  You may urinate more often. Painful urination may mean you have a bladder infection.  You may develop heartburn as a result of your pregnancy.  You may develop constipation because certain hormones are causing the muscles that push stool through your intestines to slow down.  You may develop hemorrhoids or swollen veins (varicose veins).  Your breasts may begin to grow larger and become tender. Your nipples may stick out more, and the tissue that surrounds them (areola) may become darker.  Your gums may bleed and may be  sensitive to brushing and flossing.  Dark spots or blotches (chloasma, mask of pregnancy) may develop on your face. This will likely fade after the baby is born.  Your menstrual periods will stop.  You may have a loss of appetite.  You may develop cravings for certain kinds of food.  You may have changes in your emotions from day to day, such as being excited to be pregnant or being concerned that something may go wrong with the pregnancy and baby.  You may have more vivid and strange dreams.  You may have changes in your hair. These can include thickening of your hair, rapid growth, and changes in texture. Some women also have hair loss during or after pregnancy, or hair that feels dry or thin. Your hair will most likely return to normal after your baby is born.  What to expect at prenatal visits During a routine prenatal visit:  You will be weighed to make sure you and the baby are growing normally.  Your blood pressure will be taken.  Your abdomen will be measured to track your baby's growth.  The fetal heartbeat will be listened to between weeks 10 and 14 of your pregnancy.  Test results from any previous visits will be discussed.  Your health care provider may ask you:  How you are feeling.  If you are feeling the baby move.  If you have had any abnormal symptoms, such as leaking fluid, bleeding, severe headaches,   or abdominal cramping.  If you are using any tobacco products, including cigarettes, chewing tobacco, and electronic cigarettes.  If you have any questions.  Other tests that may be performed during your first trimester include:  Blood tests to find your blood type and to check for the presence of any previous infections. The tests will also be used to check for low iron levels (anemia) and protein on red blood cells (Rh antibodies). Depending on your risk factors, or if you previously had diabetes during pregnancy, you may have tests to check for high blood  sugar that affects pregnant women (gestational diabetes).  Urine tests to check for infections, diabetes, or protein in the urine.  An ultrasound to confirm the proper growth and development of the baby.  Fetal screens for spinal cord problems (spina bifida) and Down syndrome.  HIV (human immunodeficiency virus) testing. Routine prenatal testing includes screening for HIV, unless you choose not to have this test.  You may need other tests to make sure you and the baby are doing well.  Follow these instructions at home: Medicines  Follow your health care provider's instructions regarding medicine use. Specific medicines may be either safe or unsafe to take during pregnancy.  Take a prenatal vitamin that contains at least 600 micrograms (mcg) of folic acid.  If you develop constipation, try taking a stool softener if your health care provider approves. Eating and drinking  Eat a balanced diet that includes fresh fruits and vegetables, whole grains, good sources of protein such as meat, eggs, or tofu, and low-fat dairy. Your health care provider will help you determine the amount of weight gain that is right for you.  Avoid raw meat and uncooked cheese. These carry germs that can cause birth defects in the baby.  Eating four or five small meals rather than three large meals a day may help relieve nausea and vomiting. If you start to feel nauseous, eating a few soda crackers can be helpful. Drinking liquids between meals, instead of during meals, also seems to help ease nausea and vomiting.  Limit foods that are high in fat and processed sugars, such as fried and sweet foods.  To prevent constipation: ? Eat foods that are high in fiber, such as fresh fruits and vegetables, whole grains, and beans. ? Drink enough fluid to keep your urine clear or pale yellow. Activity  Exercise only as directed by your health care provider. Most women can continue their usual exercise routine during  pregnancy. Try to exercise for 30 minutes at least 5 days a week. Exercising will help you: ? Control your weight. ? Stay in shape. ? Be prepared for labor and delivery.  Experiencing pain or cramping in the lower abdomen or lower back is a good sign that you should stop exercising. Check with your health care provider before continuing with normal exercises.  Try to avoid standing for long periods of time. Move your legs often if you must stand in one place for a long time.  Avoid heavy lifting.  Wear low-heeled shoes and practice good posture.  You may continue to have sex unless your health care provider tells you not to. Relieving pain and discomfort  Wear a good support bra to relieve breast tenderness.  Take warm sitz baths to soothe any pain or discomfort caused by hemorrhoids. Use hemorrhoid cream if your health care provider approves.  Rest with your legs elevated if you have leg cramps or low back pain.  If you develop   varicose veins in your legs, wear support hose. Elevate your feet for 15 minutes, 3-4 times a day. Limit salt in your diet. Prenatal care  Schedule your prenatal visits by the twelfth week of pregnancy. They are usually scheduled monthly at first, then more often in the last 2 months before delivery.  Write down your questions. Take them to your prenatal visits.  Keep all your prenatal visits as told by your health care provider. This is important. Safety  Wear your seat belt at all times when driving.  Make a list of emergency phone numbers, including numbers for family, friends, the hospital, and police and fire departments. General instructions  Ask your health care provider for a referral to a local prenatal education class. Begin classes no later than the beginning of month 6 of your pregnancy.  Ask for help if you have counseling or nutritional needs during pregnancy. Your health care provider can offer advice or refer you to specialists for help  with various needs.  Do not use hot tubs, steam rooms, or saunas.  Do not douche or use tampons or scented sanitary pads.  Do not cross your legs for long periods of time.  Avoid cat litter boxes and soil used by cats. These carry germs that can cause birth defects in the baby and possibly loss of the fetus by miscarriage or stillbirth.  Avoid all smoking, herbs, alcohol, and medicines not prescribed by your health care provider. Chemicals in these products affect the formation and growth of the baby.  Do not use any products that contain nicotine or tobacco, such as cigarettes and e-cigarettes. If you need help quitting, ask your health care provider. You may receive counseling support and other resources to help you quit.  Schedule a dentist appointment. At home, brush your teeth with a soft toothbrush and be gentle when you floss. Contact a health care provider if:  You have dizziness.  You have mild pelvic cramps, pelvic pressure, or nagging pain in the abdominal area.  You have persistent nausea, vomiting, or diarrhea.  You have a bad smelling vaginal discharge.  You have pain when you urinate.  You notice increased swelling in your face, hands, legs, or ankles.  You are exposed to fifth disease or chickenpox.  You are exposed to German measles (rubella) and have never had it. Get help right away if:  You have a fever.  You are leaking fluid from your vagina.  You have spotting or bleeding from your vagina.  You have severe abdominal cramping or pain.  You have rapid weight gain or loss.  You vomit blood or material that looks like coffee grounds.  You develop a severe headache.  You have shortness of breath.  You have any kind of trauma, such as from a fall or a car accident. Summary  The first trimester of pregnancy is from week 1 until the end of week 13 (months 1 through 3).  Your body goes through many changes during pregnancy. The changes vary from  woman to woman.  You will have routine prenatal visits. During those visits, your health care provider will examine you, discuss any test results you may have, and talk with you about how you are feeling. This information is not intended to replace advice given to you by your health care provider. Make sure you discuss any questions you have with your health care provider. Document Released: 05/02/2001 Document Revised: 04/19/2016 Document Reviewed: 04/19/2016 [employer] Interactive Patient Education  2017 [employer]  

## 2016-10-19 LAB — HIV ANTIBODY (ROUTINE TESTING W REFLEX): HIV Screen 4th Generation wRfx: NONREACTIVE

## 2016-10-19 LAB — ABO/RH: ABO/RH(D): O POS

## 2016-10-19 LAB — GC/CHLAMYDIA PROBE AMP (~~LOC~~) NOT AT ARMC
Chlamydia: NEGATIVE
Neisseria Gonorrhea: NEGATIVE

## 2016-12-11 ENCOUNTER — Telehealth: Payer: Self-pay | Admitting: Family Medicine

## 2016-12-11 NOTE — Telephone Encounter (Signed)
Pt called [facility] to be [facility]   [facility] [phone] ( Rheumatologist ) Please f/up

## 2016-12-14 NOTE — Telephone Encounter (Signed)
Noted Per Patient request fax the referral to  Dr Danton ClapJames Perruquet ph [phone] ( Rheumatologist ) Fax # [phone]   [address] [place], [place] [id number]

## 2017-04-10 NOTE — Telephone Encounter (Signed)
This encounter was created in error - please disregard.

## 2017-08-29 IMAGING — US US OB TRANSVAGINAL
1 series · 15 of 28 positions shown · non-contrast
Comparison: None.

CLINICAL DATA: Abdominal pain and cramping since yesterday.

EXAM:
OBSTETRIC <14 WK US AND TRANSVAGINAL OB US
TECHNIQUE: Both transabdominal and transvaginal ultrasound examinations were
performed for complete evaluation of the gestation as well as the
maternal uterus, adnexal regions, and pelvic cul-de-sac.
Transvaginal technique was performed to assess early pregnancy.

[Series 1: us ob transvaginal · 15 of 104 slices shown]
[im 1/104]
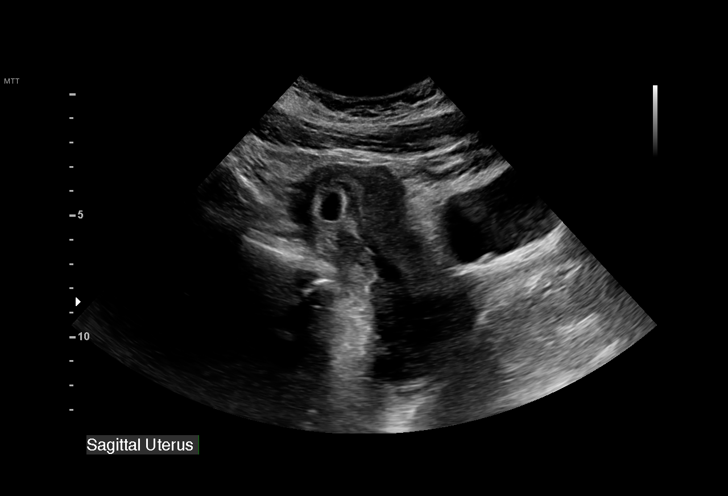
[im 8/104]
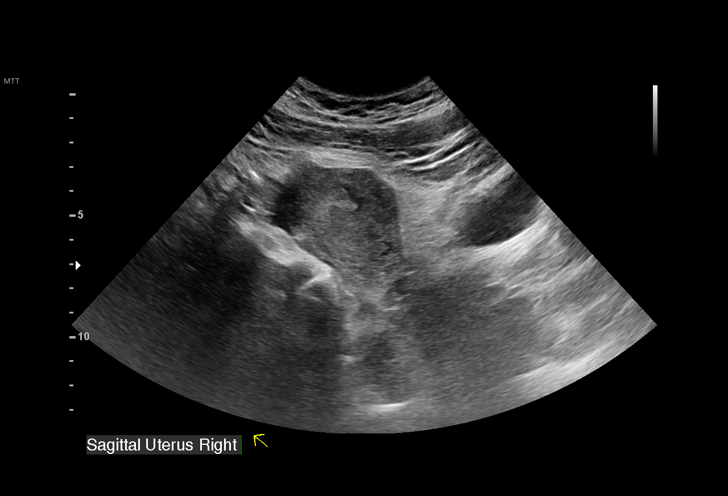
[im 16/104]
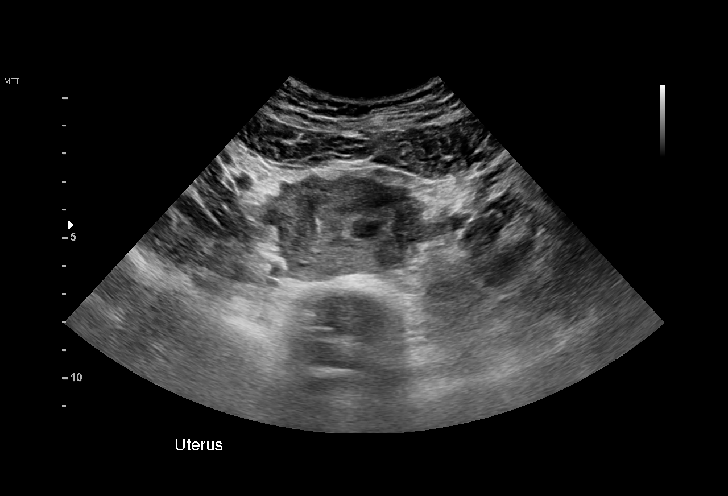
[im 23/104]
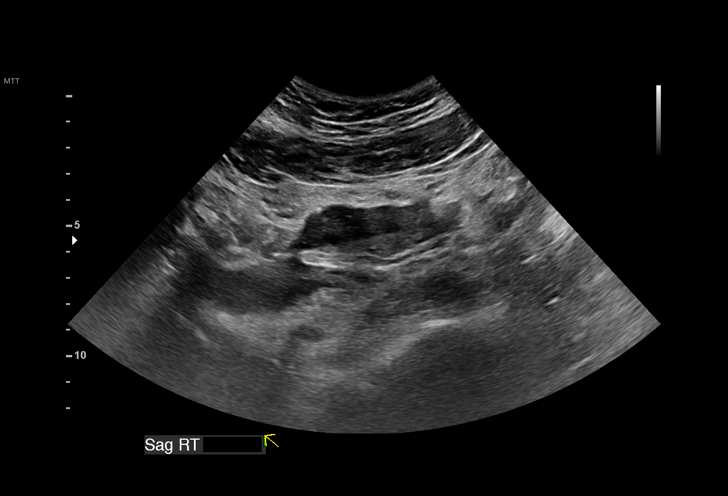
[im 31/104]
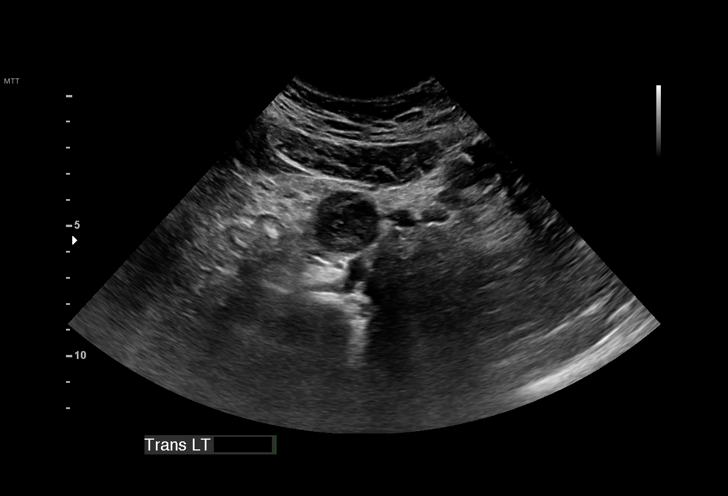
[im 39/104]
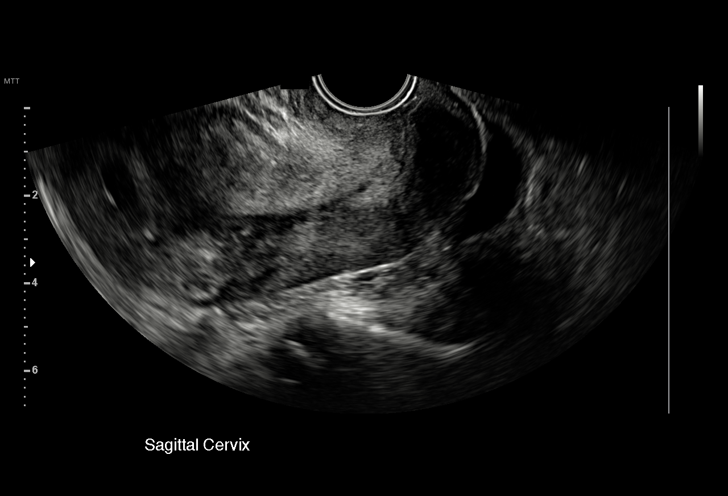
[im 46/104]
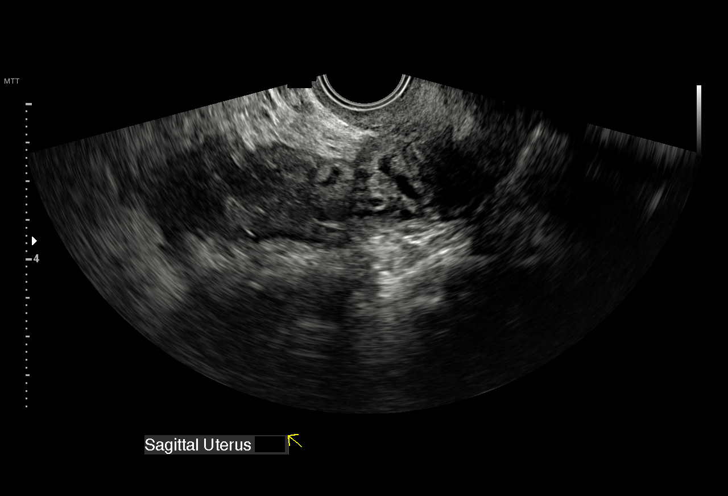
[im 54/104]
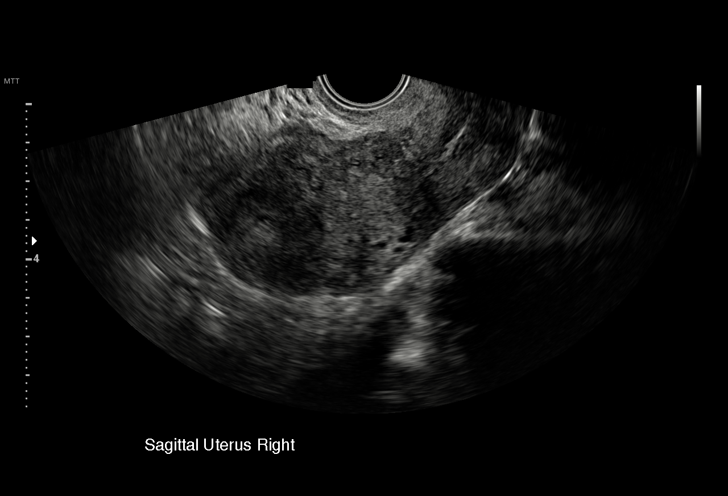
[im 58/104]
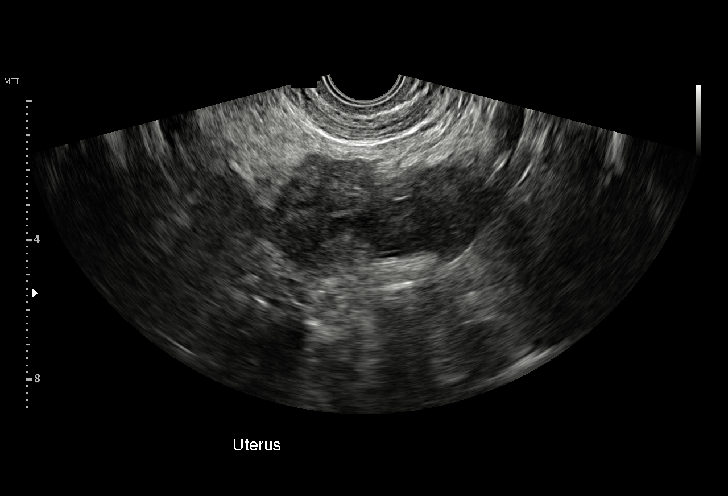
[im 65/104]
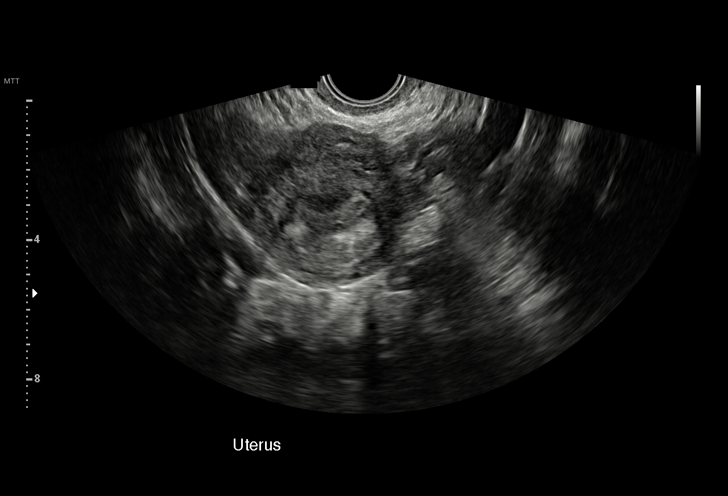
[im 73/104]
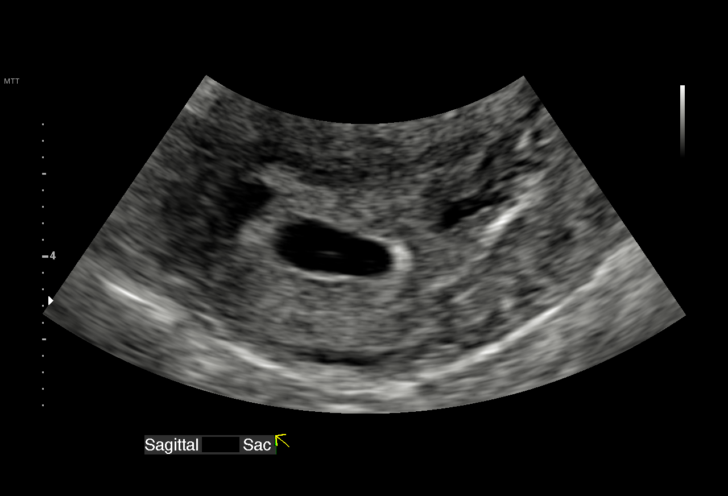
[im 81/104]
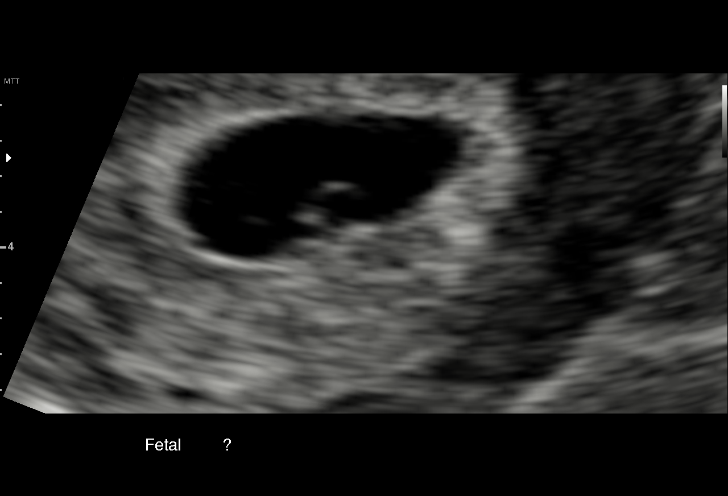
[im 88/104]
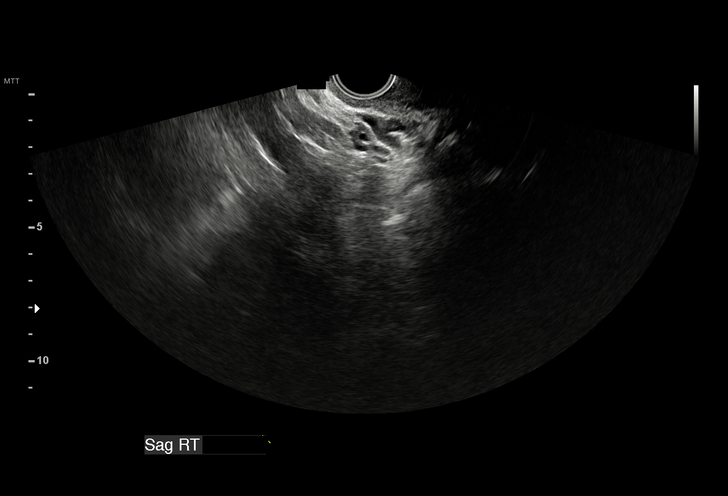
[im 96/104]
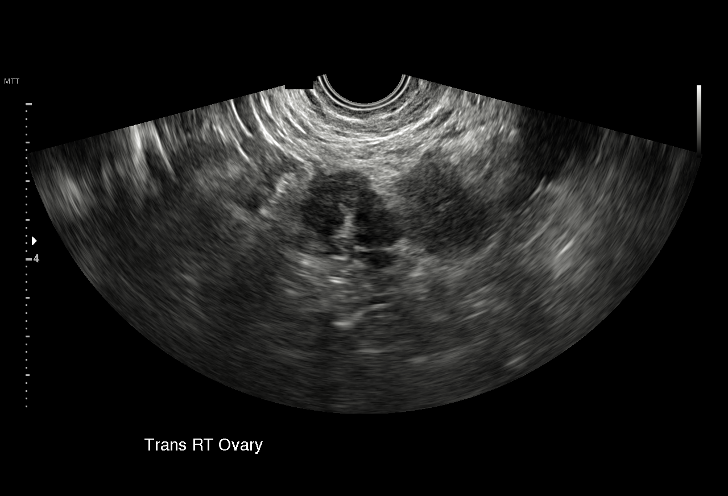
[im 104/104]
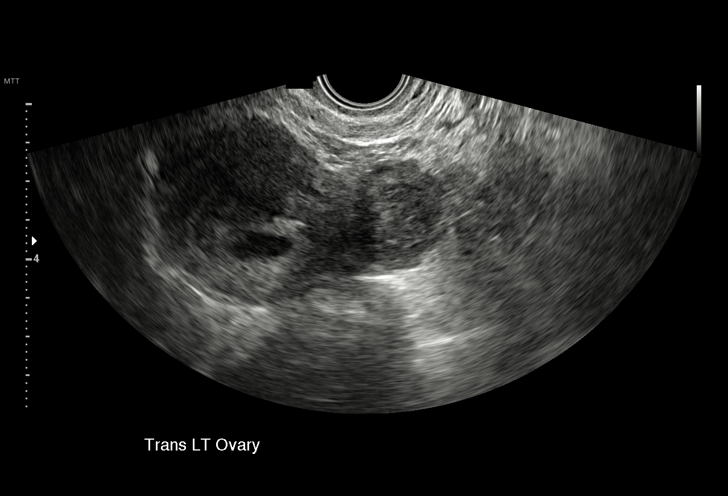

[15 of 28 positions shown; findings below may reference images not displayed]

FINDINGS: Intrauterine gestational sac: Single

Yolk sac:  Visualized.

Embryo:  Visualized.

Cardiac Activity: Not Visualized.

Heart Rate: Not applicable

CRL:  2  mm   5 w   5 d                  US EDC: 06/15/2017

Subchorionic hemorrhage:  Small periimplantation bleed.

Maternal uterus/adnexae: Corpus luteum on the left. Normal right
ovary.
IMPRESSION: The gestational sac, yolk sac and fetal pole are identified low
without demonstrable gestational cardiac activity at this time. This
may be due to the stage gestation at 5 weeks 5 days. Follow-up is
therefore recommended in 1 week to establish viability. Small
perigestational/ periimplantation bleed can be followed up as well.

## 2017-09-07 ENCOUNTER — Encounter (HOSPITAL_COMMUNITY): Payer: Self-pay
